# Patient Record
Sex: Female | Born: 1995 | Race: Black or African American | Hispanic: No | Marital: Single | Smoking: Former smoker
Health system: Southern US, Community
[De-identification: ages and names within clinical notes are randomized; demographics above are authoritative.]

## PROBLEM LIST (undated history)

## (undated) ENCOUNTER — Inpatient Hospital Stay (HOSPITAL_COMMUNITY): Payer: Self-pay

## (undated) DIAGNOSIS — F419 Anxiety disorder, unspecified: Secondary | ICD-10-CM

## (undated) DIAGNOSIS — R51 Headache: Secondary | ICD-10-CM

## (undated) DIAGNOSIS — F32A Depression, unspecified: Secondary | ICD-10-CM

## (undated) DIAGNOSIS — R519 Headache, unspecified: Secondary | ICD-10-CM

## (undated) HISTORY — PX: NO PAST SURGERIES: SHX2092

---
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
## 2016-01-20 DIAGNOSIS — Z87891 Personal history of nicotine dependence: Secondary | ICD-10-CM | POA: Diagnosis not present

## 2016-01-20 DIAGNOSIS — R079 Chest pain, unspecified: Secondary | ICD-10-CM | POA: Diagnosis present

## 2016-01-20 DIAGNOSIS — R51 Headache: Secondary | ICD-10-CM | POA: Diagnosis not present

## 2016-01-20 DIAGNOSIS — R0602 Shortness of breath: Secondary | ICD-10-CM | POA: Insufficient documentation

## 2016-01-21 ENCOUNTER — Emergency Department (HOSPITAL_COMMUNITY): Payer: Medicaid Other

## 2016-01-21 ENCOUNTER — Emergency Department (HOSPITAL_COMMUNITY)
Admission: EM | Admit: 2016-01-21 | Discharge: 2016-01-21 | Disposition: A | Discharge status: Home / Self Care | Payer: Medicaid Other | Attending: Emergency Medicine | Admitting: Emergency Medicine

## 2016-01-21 ENCOUNTER — Encounter (HOSPITAL_COMMUNITY): Payer: Self-pay | Admitting: Emergency Medicine

## 2016-01-21 DIAGNOSIS — R079 Chest pain, unspecified: Secondary | ICD-10-CM

## 2016-01-21 LAB — BASIC METABOLIC PANEL
Anion gap: 9 (ref 5–15)
BUN: 10 mg/dL (ref 6–20)
CHLORIDE: 103 mmol/L (ref 101–111)
CO2: 25 mmol/L (ref 22–32)
Calcium: 9 mg/dL (ref 8.9–10.3)
Creatinine, Ser: 0.58 mg/dL (ref 0.44–1.00)
GFR calc Af Amer: >60 mL/min (ref >60)
GLUCOSE: 88 mg/dL (ref 65–99)
POTASSIUM: 3.8 mmol/L (ref 3.5–5.1)
Sodium: 137 mmol/L (ref 135–145)

## 2016-01-21 LAB — CBC
HEMATOCRIT: 38.7 % (ref 36.0–46.0)
Hemoglobin: 12.6 g/dL (ref 12.0–15.0)
MCH: 28.5 pg (ref 26.0–34.0)
MCHC: 32.6 g/dL (ref 30.0–36.0)
MCV: 87.6 fL (ref 78.0–100.0)
Platelets: 214 10*3/uL (ref 150–400)
RBC: 4.42 MIL/uL (ref 3.87–5.11)
RDW: 12.1 % (ref 11.5–15.5)
WBC: 6 10*3/uL (ref 4.0–10.5)

## 2016-01-21 LAB — D-DIMER, QUANTITATIVE: D-Dimer, Quant: 0.27 ug/mL-FEU (ref 0.00–0.50)

## 2016-01-21 LAB — I-STAT TROPONIN, ED: Troponin i, poc: 0 ng/mL (ref 0.00–0.08)

## 2016-01-21 MED ORDER — IBUPROFEN 800 MG PO TABS
800.0000 mg | ORAL_TABLET | Freq: Once | ORAL | Status: AC
  Administered 2016-01-21: 800 mg via ORAL
  Filled 2016-01-21: qty 1

## 2016-01-21 MED ORDER — GI COCKTAIL ~~LOC~~
30.0000 mL | Freq: Once | ORAL | Status: AC
  Administered 2016-01-21: 30 mL via ORAL
  Filled 2016-01-21: qty 30

## 2016-01-21 MED ORDER — NAPROXEN 500 MG PO TABS: 500.0000 mg | ORAL_TABLET | Freq: Two times a day (BID) | ORAL | Status: DC

## 2016-01-21 NOTE — ED Notes (Signed)
C/o pressure in center of chest, headache, and sob with exertion x 3 days.  Denies nausea and vomiting.

## 2016-01-21 NOTE — ED Notes (Signed)
Lab at bedside

## 2016-01-21 NOTE — ED Provider Notes (Signed)
CSN: [id number]     Arrival date & time 01/20/16  2346 History   First MD Initiated Contact with Patient 01/21/16 0135     Chief Complaint  Patient presents with  . Chest Pain     (Consider location/radiation/quality/duration/timing/severity/associated sxs/prior Treatment) HPI Comments: 20 year old female with a past medical history presents to the emergency department for evaluation of central, nonradiating, chest pressure. Patient states that she has had symptoms intermittently over the past 3 days. She states that the pressure is worse with prolonged movement. She is also having a mild, global headache at this time. No medications taken prior to arrival for symptoms. Patient denies fever, nausea, vomiting, syncope, abdominal pain, leg swelling, and the use of birth control. No recent surgeries or hospitalizations. No personal or family history of blood clots. Patient denies prolonged travel.  Patient is a 20 y.o. female presenting with chest pain. The history is provided by the patient. No language interpreter was used.  Chest Pain Associated symptoms: shortness of breath   Associated symptoms: no nausea and not vomiting     Past Medical History  Diagnosis Date  . Medical history non-contributory    Past Surgical History  Procedure Laterality Date  . No past surgeries     No family history on file. Social History  Substance Use Topics  . Smoking status: Former Smoker    Types: Cigarettes    Quit date: 12/22/2015  . Smokeless tobacco: None  . Alcohol Use: Yes   OB History    Gravida Para Term Preterm AB TAB SAB Ectopic Multiple Living   3    3 1 2    0      Review of Systems  Respiratory: Positive for shortness of breath.   Cardiovascular: Positive for chest pain. Negative for leg swelling.  Gastrointestinal: Negative for nausea and vomiting.  Neurological: Negative for syncope.  All other systems reviewed and are negative.   Allergies  Review of patient's allergies  indicates no known allergies.  Home Medications   Prior to Admission medications   Medication Sig Start Date End Date Taking? Authorizing Provider  naproxen (NAPROSYN) 500 MG tablet Take 1 tablet (500 mg total) by mouth 2 (two) times daily. 01/21/16   Antony MaduraKelly Booker Bhatnagar, PA-C   BP 113/74 mmHg  Pulse 62  Temp(Src) 98.5 F (36.9 C) (Oral)  Resp 17  SpO2 100%  LMP 12/30/2015   Physical Exam  Constitutional: She is oriented to person, place, and time. She appears well-developed and well-nourished. No distress.  Nontoxic/nonseptic appearing.  HENT:  Head: Normocephalic and atraumatic.  Eyes: Conjunctivae and EOM are normal. No scleral icterus.  Neck: Normal range of motion.  No JVD  Cardiovascular: Normal rate, regular rhythm and intact distal pulses.   Pulmonary/Chest: Effort normal and breath sounds normal. No respiratory distress. She has no wheezes. She has no rales.  Respirations even and unlabored. Lungs CTAB.  Abdominal: Soft. She exhibits no distension. There is no tenderness. There is no rebound.  Soft, nontender, nondistended abdomen.  Musculoskeletal: Normal range of motion.  Neurological: She is alert and oriented to person, place, and time. She exhibits normal muscle tone. Coordination normal.  GCS 15. Patient moving all extremities.  Skin: Skin is warm and dry. No rash noted. She is not diaphoretic. No erythema. No pallor.  Psychiatric: She has a normal mood and affect. Her behavior is normal.  Nursing note and vitals reviewed.   ED Course  Procedures (including critical care time) Labs Review Labs Reviewed  BASIC METABOLIC PANEL  CBC  D-DIMER, QUANTITATIVE (NOT AT [facility])  Rosezena Sensor, ED    Imaging Review Dg Chest 2 View  01/21/2016  CLINICAL DATA:  Mid chest pain for 3 days EXAM: CHEST  2 VIEW COMPARISON:  None. FINDINGS: The heart size and mediastinal contours are within normal limits. Both lungs are clear. The visualized skeletal structures are unremarkable.  IMPRESSION: No active cardiopulmonary disease. Electronically Signed   By: Burman Nieves M.D.   On: 01/21/2016 00:52   I have personally reviewed and evaluated these images and lab results as part of my medical decision-making.   ED ECG REPORT   Date: 01/21/2016  Rate: 54  Rhythm: sinus bradycardia  QRS Axis: normal  Intervals: normal  ST/T Wave abnormalities: normal  Conduction Disutrbances:none  Narrative Interpretation: Sinus bradycardia; otherwise normal ECG  Old EKG Reviewed: none available  I have personally reviewed the EKG tracing and agree with the computerized printout as noted.   MDM   Final diagnoses:  Chest pain, unspecified chest pain type    20 year old female presents to the emergency department for evaluation of chest pain which is worse with prolonged movement. Suspect musculoskeletal etiology. No known family history of sudden cardiac death, per patient. Heart score is 1 for prior smoking hx, c/w low risk of ACS. Cardiac work up today is reassuring. Chest x-ray negative for acute cardiopulmonary findings. Patient also PERC negative with negative D dimer. Doubt PE.  Plan to manage with naproxen on an outpatient basis. Patient referred to her primary care doctor for follow-up. Return precautions discussed and provided. Patient discharged in satisfactory condition with no unaddressed concerns.   Filed Vitals:   01/21/16 0200 01/21/16 0215 01/21/16 0230 01/21/16 0258  BP: 123/84 114/66 113/74 118/65  Pulse: 66 57 62 58  Temp:      TempSrc:      Resp: SpO2: 100% 100% 100% 100%     Antony Madura, PA-C 01/21/16 0300  Geoffery Lyons, MD 01/21/16 [phone]

## 2016-01-21 NOTE — Discharge Instructions (Signed)
Nonspecific Chest Pain  °Chest pain can be caused by many different conditions. There is always a chance that your pain could be related to something serious, such as a heart attack or a blood clot in your lungs. Chest pain can also be caused by conditions that are not life-threatening. If you have chest pain, it is very important to follow up with your health care provider. °CAUSES  °Chest pain can be caused by: °· Heartburn. °· Pneumonia or bronchitis. °· Anxiety or stress. °· Inflammation around your heart (pericarditis) or lung (pleuritis or pleurisy). °· A blood clot in your lung. °· A collapsed lung (pneumothorax). It can develop suddenly on its own (spontaneous pneumothorax) or from trauma to the chest. °· Shingles infection (varicella-zoster virus). °· Heart attack. °· Damage to the bones, muscles, and cartilage that make up your chest wall. This can include: °¨ Bruised bones due to injury. °¨ Strained muscles or cartilage due to frequent or repeated coughing or overwork. °¨ Fracture to one or more ribs. °¨ Sore cartilage due to inflammation (costochondritis). °RISK FACTORS  °Risk factors for chest pain may include: °· Activities that increase your risk for trauma or injury to your chest. °· Respiratory infections or conditions that cause frequent coughing. °· Medical conditions or overeating that can cause heartburn. °· Heart disease or family history of heart disease. °· Conditions or health behaviors that increase your risk of developing a blood clot. °· Having had chicken pox (varicella zoster). °SIGNS AND SYMPTOMS °Chest pain can feel like: °· Burning or tingling on the surface of your chest or deep in your chest. °· Crushing, pressure, aching, or squeezing pain. °· Dull or sharp pain that is worse when you move, cough, or take a deep breath. °· Pain that is also felt in your back, neck, shoulder, or arm, or pain that spreads to any of these areas. °Your chest pain may come and go, or it may stay  constant. °DIAGNOSIS °Lab tests or other studies may be needed to find the cause of your pain. Your health care provider may have you take a test called an ambulatory ECG (electrocardiogram). An ECG records your heartbeat patterns at the time the test is performed. You may also have other tests, such as: °· Transthoracic echocardiogram (TTE). During echocardiography, sound waves are used to create a picture of all of the heart structures and to look at how blood flows through your heart. °· Transesophageal echocardiogram (TEE). This is a more advanced imaging test that obtains images from inside your body. It allows your health care provider to see your heart in finer detail. °· Cardiac monitoring. This allows your health care provider to monitor your heart rate and rhythm in real time. °· Holter monitor. This is a portable device that records your heartbeat and can help to diagnose abnormal heartbeats. It allows your health care provider to track your heart activity for several days, if needed. °· Stress tests. These can be done through exercise or by taking medicine that makes your heart beat more quickly. °· Blood tests. °· Imaging tests. °TREATMENT  °Your treatment depends on what is causing your chest pain. Treatment may include: °· Medicines. These may include: °¨ Acid blockers for heartburn. °¨ Anti-inflammatory medicine. °¨ Pain medicine for inflammatory conditions. °¨ Antibiotic medicine, if an infection is present. °¨ Medicines to dissolve blood clots. °¨ Medicines to treat coronary artery disease. °· Supportive care for conditions that do not require medicines. This may include: °¨ Resting. °¨ Applying heat   or cold packs to injured areas. °¨ Limiting activities until pain decreases. °HOME CARE INSTRUCTIONS °· If you were prescribed an antibiotic medicine, finish it all even if you start to feel better. °· Avoid any activities that bring on chest pain. °· Do not use any tobacco products, including  cigarettes, chewing tobacco, or electronic cigarettes. If you need help quitting, ask your health care provider. °· Do not drink alcohol. °· Take medicines only as directed by your health care provider. °· Keep all follow-up visits as directed by your health care provider. This is important. This includes any further testing if your chest pain does not go away. °· If heartburn is the cause for your chest pain, you may be told to keep your head raised (elevated) while sleeping. This reduces the chance that acid will go from your stomach into your esophagus. °· Make lifestyle changes as directed by your health care provider. These may include: °¨ Getting regular exercise. Ask your health care provider to suggest some activities that are safe for you. °¨ Eating a heart-healthy diet. A registered dietitian can help you to learn healthy eating options. °¨ Maintaining a healthy weight. °¨ Managing diabetes, if necessary. °¨ Reducing stress. °SEEK MEDICAL CARE IF: °· Your chest pain does not go away after treatment. °· You have a rash with blisters on your chest. °· You have a fever. °SEEK IMMEDIATE MEDICAL CARE IF:  °· Your chest pain is worse. °· You have an increasing cough, or you cough up blood. °· You have severe abdominal pain. °· You have severe weakness. °· You faint. °· You have chills. °· You have sudden, unexplained chest discomfort. °· You have sudden, unexplained discomfort in your arms, back, neck, or jaw. °· You have shortness of breath at any time. °· You suddenly start to sweat, or your skin gets clammy. °· You feel nauseous or you vomit. °· You suddenly feel light-headed or dizzy. °· Your heart begins to beat quickly, or it feels like it is skipping beats. °These symptoms may represent a serious problem that is an emergency. Do not wait to see if the symptoms will go away. Get medical help right away. Call your local emergency services (911 in the [place] Do not drive yourself to the hospital. °  °This  information is not intended to replace advice given to you by your health care provider. Make sure you discuss any questions you have with your health care provider. °  °Document Released: 06/15/2005 Document Revised: 09/26/2014 Document Reviewed: 04/11/2014 °Elsevier Interactive Patient Education ©2016 [employer] ° °

## 2016-09-28 ENCOUNTER — Encounter (HOSPITAL_COMMUNITY): Payer: Self-pay | Admitting: Emergency Medicine

## 2016-09-28 ENCOUNTER — Emergency Department (HOSPITAL_COMMUNITY)
Admission: EM | Admit: 2016-09-28 | Discharge: 2016-09-29 | Disposition: A | Discharge status: Home / Self Care | Payer: Medicaid Other | Attending: Emergency Medicine | Admitting: Emergency Medicine

## 2016-09-28 DIAGNOSIS — Z87891 Personal history of nicotine dependence: Secondary | ICD-10-CM | POA: Diagnosis not present

## 2016-09-28 DIAGNOSIS — B349 Viral infection, unspecified: Secondary | ICD-10-CM | POA: Diagnosis not present

## 2016-09-28 DIAGNOSIS — R05 Cough: Secondary | ICD-10-CM | POA: Diagnosis present

## 2016-09-28 MED ORDER — SODIUM CHLORIDE 0.9 % IV BOLUS (SEPSIS)
1000.0000 mL | Freq: Once | INTRAVENOUS | Status: AC
  Administered 2016-09-29: 1000 mL via INTRAVENOUS

## 2016-09-28 MED ORDER — ONDANSETRON HCL 4 MG/2ML IJ SOLN
4.0000 mg | Freq: Once | INTRAMUSCULAR | Status: AC
  Administered 2016-09-29: 4 mg via INTRAVENOUS
  Filled 2016-09-28: qty 2

## 2016-09-28 NOTE — ED Provider Notes (Signed)
[facility]-EMERGENCY DEPT Provider Note   CSN: [id number] Arrival date & time: 09/28/16  1832     History   Chief Complaint Chief Complaint  Patient presents with  . Emesis  . Cough    HPI Allison Hamilton is a 21 y.o. female.  HPI 21 y.o. female, presents to the Emergency Department today complaining of emesis and cough since Thursday of last week. Notes hot flashes with subjective fevers. No diarrhea. No CP/ABD pain. Notes mild shortness of breath with coughing. Sore throat as well as congestion. Attempted home remedies with minimal relief. Noted sick contacts. No other symptoms noted.   Past Medical History:  Diagnosis Date  . Medical history non-contributory     There are no active problems to display for this patient.   Past Surgical History:  Procedure Laterality Date  . NO PAST SURGERIES      OB History    Gravida Para Term Preterm AB Living   3       3 0   SAB TAB Ectopic Multiple Live Births   2 1             Home Medications    Prior to Admission medications   Medication Sig Start Date End Date Taking? Authorizing Provider  naproxen (NAPROSYN) 500 MG tablet Take 1 tablet (500 mg total) by mouth 2 (two) times daily. 01/21/16   Antony Madura, PA-C    Family History History reviewed. No pertinent family history.  Social History Social History  Substance Use Topics  . Smoking status: Former Smoker    Types: Cigarettes    Quit date: 12/22/2015  . Smokeless tobacco: Never Used  . Alcohol use Yes     Allergies   Patient has no known allergies.   Review of Systems Review of Systems ROS reviewed and all are negative for acute change except as noted in the HPI.  Physical Exam Updated Vital Signs BP 120/69 (BP Location: Left Arm)   Pulse 113   Temp 98.7 F (37.1 C) (Oral)   Resp 15   Ht 5\' 3"  (1.6 m)   LMP 09/25/2016   SpO2 99%   Physical Exam  Constitutional: She is oriented to person, place, and time. Vital signs are normal. She appears  well-developed and well-nourished. No distress.  HENT:  Head: Normocephalic and atraumatic.  Right Ear: Hearing, tympanic membrane, external ear and ear canal normal.  Left Ear: Hearing, tympanic membrane, external ear and ear canal normal.  Nose: Nose normal.  Mouth/Throat: Uvula is midline, oropharynx is clear and moist and mucous membranes are normal. No trismus in the jaw. No oropharyngeal exudate, posterior oropharyngeal erythema or tonsillar abscesses.  Eyes: Conjunctivae and EOM are normal. Pupils are equal, round, and reactive to light.  Neck: Normal range of motion. Neck supple. No tracheal deviation present.  Cardiovascular: Regular rhythm, S1 normal, S2 normal, normal heart sounds, intact distal pulses and normal pulses.  Tachycardia present.   Pulmonary/Chest: Effort normal and breath sounds normal. No respiratory distress. She has no decreased breath sounds. She has no wheezes. She has no rhonchi. She has no rales.  Abdominal: Soft. Normal appearance and bowel sounds are normal. There is no tenderness. There is no rigidity, no rebound, no guarding, no CVA tenderness, no tenderness at McBurney's point and negative Murphy's sign.  Musculoskeletal: Normal range of motion.  Neurological: She is alert and oriented to person, place, and time.  Skin: Skin is warm and dry.  Psychiatric: She has a normal mood  and affect. Her speech is normal and behavior is normal. Thought content normal.  Nursing note and vitals reviewed.  ED Treatments / Results  Labs (all labs ordered are listed, but only abnormal results are displayed) Labs Reviewed  CBC - Abnormal; Notable for the following:       Result Value   WBC 11.2 (*)    All other components within normal limits  BASIC METABOLIC PANEL - Abnormal; Notable for the following:    Sodium 134 (*)    Potassium 3.0 (*)    Chloride 98 (*)    All other components within normal limits   EKG  EKG Interpretation None      Radiology Dg Chest  2 View  Result Date: 09/29/2016 CLINICAL DATA:  Acute onset of cough, vomiting, hot flashes and throat pain. Initial encounter. EXAM: CHEST  2 VIEW COMPARISON:  Chest radiograph performed 01/21/2016 FINDINGS: The lungs are well-aerated and clear. There is no evidence of focal opacification, pleural effusion or pneumothorax. The heart is normal in size; the mediastinal contour is within normal limits. No acute osseous abnormalities are seen. IMPRESSION: No acute cardiopulmonary process seen. Electronically Signed   By: Roanna RaiderJeffery  Chang M.D.   On: 09/29/2016 00:10    Procedures Procedures (including critical care time)  Medications Ordered in ED Medications  sodium chloride 0.9 % bolus 1,000 mL (not administered)   Initial Impression / Assessment and Plan / ED Course  I have reviewed the triage vital signs and the nursing notes.  Pertinent labs & imaging results that were available during my care of the patient were reviewed by me and considered in my medical decision making (see chart for details).  Clinical Course    Final Clinical Impressions(s) / ED Diagnoses  {I have reviewed and evaluated the relevant laboratory values. {I have reviewed and evaluated the relevant imaging studies.  {I have reviewed the relevant previous healthcare records.  {I obtained HPI from historian.   ED Course:  Assessment: Pt is a 20yF presents with URI symptoms with cough and subjective fevers. Post tussive emesis noted. No CP/ABD pain. No diarrhea. On exam, pt in NAD. VS with slight tachycardia. Afebrile. Lungs CTA, Heart RRR. Abdomen nontender/soft. Pt CXR negative for acute infiltrate. Labs unremarkable. Patients symptoms are consistent with URI, likely viral etiology. Discussed that antibiotics are not indicated for viral infections. Pt will be discharged with symptomatic treatment. Given rx Zofran and Tessalon. Pt improved with fluids in ED. Verbalizes understanding and is agreeable with plan. Pt is  hemodynamically stable & in NAD prior to dc  Disposition/Plan:  DC Home Additional Verbal discharge instructions given and discussed with patient.  Pt Instructed to f/u with PCP in the next week for evaluation and treatment of symptoms. Return precautions given Pt acknowledges and agrees with plan  Supervising Physician Azalia BilisKevin Campos, MD  Final diagnoses:  Viral syndrome    New Prescriptions New Prescriptions   No medications on file     Audry Piliyler Kharis Lapenna, PA-C 09/29/16 0101    Azalia BilisKevin Campos, MD 09/29/16 [phone]

## 2016-09-28 NOTE — ED Triage Notes (Signed)
Pt states she has been sick since thurs with cough, vomiting, hot flashes, throat pain.

## 2016-09-29 ENCOUNTER — Emergency Department (HOSPITAL_COMMUNITY): Payer: Medicaid Other

## 2016-09-29 LAB — BASIC METABOLIC PANEL WITH GFR
Anion gap: 14 (ref 5–15)
BUN: 9 mg/dL (ref 6–20)
CO2: 22 mmol/L (ref 22–32)
Calcium: 9.1 mg/dL (ref 8.9–10.3)
Chloride: 98 mmol/L — ABNORMAL LOW (ref 101–111)
Creatinine, Ser: 0.68 mg/dL (ref 0.44–1.00)
GFR calc Af Amer: >60 mL/min
GFR calc non Af Amer: >60 mL/min
Glucose, Bld: 98 mg/dL (ref 65–99)
Potassium: 3 mmol/L — ABNORMAL LOW (ref 3.5–5.1)
Sodium: 134 mmol/L — ABNORMAL LOW (ref 135–145)

## 2016-09-29 LAB — CBC
HCT: 38.2 % (ref 36.0–46.0)
Hemoglobin: 13.2 g/dL (ref 12.0–15.0)
MCH: 28.3 pg (ref 26.0–34.0)
MCHC: 34.6 g/dL (ref 30.0–36.0)
MCV: 82 fL (ref 78.0–100.0)
Platelets: 221 K/uL (ref 150–400)
RBC: 4.66 MIL/uL (ref 3.87–5.11)
RDW: 12.5 % (ref 11.5–15.5)
WBC: 11.2 K/uL — ABNORMAL HIGH (ref 4.0–10.5)

## 2016-09-29 MED ORDER — KETOROLAC TROMETHAMINE 30 MG/ML IJ SOLN
30.0000 mg | Freq: Once | INTRAMUSCULAR | Status: AC
  Administered 2016-09-29: 30 mg via INTRAVENOUS
  Filled 2016-09-29: qty 1

## 2016-09-29 MED ORDER — ONDANSETRON HCL 4 MG PO TABS: 4.0000 mg | ORAL_TABLET | Freq: Four times a day (QID) | ORAL | 0 refills | Status: DC

## 2016-09-29 MED ORDER — POTASSIUM CHLORIDE CRYS ER 20 MEQ PO TBCR
40.0000 meq | EXTENDED_RELEASE_TABLET | Freq: Once | ORAL | Status: AC
  Administered 2016-09-29: 40 meq via ORAL
  Filled 2016-09-29: qty 2

## 2016-09-29 MED ORDER — BENZONATATE 100 MG PO CAPS: 100.0000 mg | ORAL_CAPSULE | Freq: Three times a day (TID) | ORAL | 0 refills | Status: DC

## 2016-09-29 NOTE — Discharge Instructions (Signed)
Please read and follow all provided instructions.  Your diagnoses today include:  1. Viral syndrome     You appear to have an upper respiratory infection (URI). An upper respiratory tract infection, or cold, is a viral infection of the air passages leading to the lungs. It should improve gradually after 5-7 days. You may have a lingering cough that lasts for 2- 4 weeks after the infection.  Tests performed today include: Vital signs. See below for your results today.   Medications prescribed:   Take any prescribed medications only as directed. Treatment for your infection is aimed at treating the symptoms. There are no medications, such as antibiotics, that will cure your infection.   Home care instructions:  Follow any educational materials contained in this packet.   Your illness is contagious and can be spread to others, especially during the first 3 or 4 days. It cannot be cured by antibiotics or other medicines. Take basic precautions such as washing your hands often, covering your mouth when you cough or sneeze, and avoiding public places where you could spread your illness to others.   Please continue drinking plenty of fluids.  Use over-the-counter medicines as needed as directed on packaging for symptom relief.  You may also use ibuprofen or tylenol as directed on packaging for pain or fever.  Do not take multiple medicines containing Tylenol or acetaminophen to avoid taking too much of this medication.  Follow-up instructions: Please follow-up with your primary care provider in the next 3 days for further evaluation of your symptoms if you are not feeling better.   Return instructions:  Please return to the Emergency Department if you experience worsening symptoms.  RETURN IMMEDIATELY IF you develop shortness of breath, confusion or altered mental status, a new rash, become dizzy, faint, or poorly responsive, or are unable to be cared for at home. Please return if you have  persistent vomiting and cannot keep down fluids or develop a fever that is not controlled by tylenol or motrin.   Please return if you have any other emergent concerns.  Additional Information:  Your vital signs today were: BP 120/69 (BP Location: Left Arm)    Pulse 113    Temp 98.7 F (37.1 C) (Oral)    Resp 15    Ht 5\' 3"  (1.6 m)    LMP 09/25/2016    SpO2 99%  If your blood pressure (BP) was elevated above 135/85 this visit, please have this repeated by your doctor within one month. --------------

## 2017-03-26 ENCOUNTER — Encounter (HOSPITAL_COMMUNITY): Payer: Self-pay

## 2017-03-26 ENCOUNTER — Emergency Department (HOSPITAL_COMMUNITY)
Admission: EM | Admit: 2017-03-26 | Discharge: 2017-03-27 | Disposition: A | Discharge status: Home / Self Care | Payer: Medicaid Other | Attending: Emergency Medicine | Admitting: Emergency Medicine

## 2017-03-26 DIAGNOSIS — Y92009 Unspecified place in unspecified non-institutional (private) residence as the place of occurrence of the external cause: Secondary | ICD-10-CM | POA: Diagnosis not present

## 2017-03-26 DIAGNOSIS — Z79899 Other long term (current) drug therapy: Secondary | ICD-10-CM | POA: Insufficient documentation

## 2017-03-26 DIAGNOSIS — Y9389 Activity, other specified: Secondary | ICD-10-CM | POA: Diagnosis not present

## 2017-03-26 DIAGNOSIS — Z87891 Personal history of nicotine dependence: Secondary | ICD-10-CM | POA: Diagnosis not present

## 2017-03-26 DIAGNOSIS — F331 Major depressive disorder, recurrent, moderate: Secondary | ICD-10-CM | POA: Diagnosis present

## 2017-03-26 DIAGNOSIS — F329 Major depressive disorder, single episode, unspecified: Secondary | ICD-10-CM | POA: Diagnosis present

## 2017-03-26 DIAGNOSIS — F4321 Adjustment disorder with depressed mood: Secondary | ICD-10-CM

## 2017-03-26 DIAGNOSIS — Z046 Encounter for general psychiatric examination, requested by authority: Secondary | ICD-10-CM | POA: Diagnosis not present

## 2017-03-26 DIAGNOSIS — T1491XA Suicide attempt, initial encounter: Secondary | ICD-10-CM | POA: Diagnosis not present

## 2017-03-26 DIAGNOSIS — Y999 Unspecified external cause status: Secondary | ICD-10-CM | POA: Insufficient documentation

## 2017-03-26 DIAGNOSIS — X781XXA Intentional self-harm by knife, initial encounter: Secondary | ICD-10-CM | POA: Diagnosis not present

## 2017-03-26 DIAGNOSIS — R45851 Suicidal ideations: Secondary | ICD-10-CM | POA: Diagnosis not present

## 2017-03-26 LAB — CBC
HCT: 39.1 % (ref 36.0–46.0)
Hemoglobin: 13.5 g/dL (ref 12.0–15.0)
MCH: 28.8 pg (ref 26.0–34.0)
MCHC: 34.5 g/dL (ref 30.0–36.0)
MCV: 83.4 fL (ref 78.0–100.0)
PLATELETS: 236 10*3/uL (ref 150–400)
RBC: 4.69 MIL/uL (ref 3.87–5.11)
RDW: 14.1 % (ref 11.5–15.5)
WBC: 13.3 10*3/uL — ABNORMAL HIGH (ref 4.0–10.5)

## 2017-03-26 LAB — RAPID URINE DRUG SCREEN, HOSP PERFORMED
AMPHETAMINES: NOT DETECTED
BENZODIAZEPINES: NOT DETECTED
Barbiturates: NOT DETECTED
Cocaine: NOT DETECTED
OPIATES: NOT DETECTED
Tetrahydrocannabinol: POSITIVE — AB

## 2017-03-26 LAB — COMPREHENSIVE METABOLIC PANEL
ALT: 15 U/L (ref 14–54)
AST: 27 U/L (ref 15–41)
Albumin: 4.7 g/dL (ref 3.5–5.0)
Alkaline Phosphatase: 80 U/L (ref 38–126)
Anion gap: 11 (ref 5–15)
BUN: 10 mg/dL (ref 6–20)
CHLORIDE: 104 mmol/L (ref 101–111)
CO2: 22 mmol/L (ref 22–32)
Calcium: 9.3 mg/dL (ref 8.9–10.3)
Creatinine, Ser: 0.8 mg/dL (ref 0.44–1.00)
Glucose, Bld: 132 mg/dL — ABNORMAL HIGH (ref 65–99)
POTASSIUM: 3.5 mmol/L (ref 3.5–5.1)
Sodium: 137 mmol/L (ref 135–145)
Total Bilirubin: 0.5 mg/dL (ref 0.3–1.2)
Total Protein: 8.8 g/dL — ABNORMAL HIGH (ref 6.5–8.1)

## 2017-03-26 LAB — ETHANOL

## 2017-03-26 LAB — I-STAT BETA HCG BLOOD, ED (MC, WL, AP ONLY): I-stat hCG, quantitative: <5 m[IU]/mL (ref <5)

## 2017-03-26 LAB — ACETAMINOPHEN LEVEL
Acetaminophen (Tylenol), Serum: 49 ug/mL — ABNORMAL HIGH (ref 10–30)
Acetaminophen (Tylenol), Serum: <10 ug/mL — ABNORMAL LOW (ref 10–30)

## 2017-03-26 LAB — SALICYLATE LEVEL

## 2017-03-26 MED ORDER — IBUPROFEN 200 MG PO TABS
600.0000 mg | ORAL_TABLET | Freq: Three times a day (TID) | ORAL | Status: DC | PRN
  Administered 2017-03-26: 600 mg via ORAL
  Filled 2017-03-26: qty 3

## 2017-03-26 MED ORDER — ALUM & MAG HYDROXIDE-SIMETH 200-200-20 MG/5ML PO SUSP: 30.0000 mL | Freq: Four times a day (QID) | ORAL | Status: DC | PRN

## 2017-03-26 MED ORDER — ONDANSETRON HCL 4 MG PO TABS: 4.0000 mg | ORAL_TABLET | Freq: Three times a day (TID) | ORAL | Status: DC | PRN

## 2017-03-26 MED ORDER — LORAZEPAM 1 MG PO TABS
1.0000 mg | ORAL_TABLET | Freq: Once | ORAL | Status: AC
  Administered 2017-03-26: 1 mg via ORAL
  Filled 2017-03-26: qty 1

## 2017-03-26 NOTE — ED Notes (Signed)
No reaction to medication noted resting in bed with eyes closed hospital sitter watching pt no respiratory or acute distress noted.

## 2017-03-26 NOTE — Progress Notes (Signed)
TTS consult ordered at 21:56. No EDP note in the system. It is unclear if the pt is medically cleared and able to be assessed due to no EDP note. Arlys JohnBrian, RN notified there is no EDP note.  Princess BruinsAquicha Yader Criger, MSW, LCSWA TTS Specialist [phone]

## 2017-03-26 NOTE — ED Notes (Signed)
Pt upset and crying wants something to help her calm down no respiratory or acute distress noted alert and oriented x 3 hospital sitter at bedside.
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
## 2017-03-26 NOTE — ED Triage Notes (Signed)
Per [employer], called to residence for pt attempting to take knife to cut herself.  Pt had knife in hand and removed by mom.  Tearful on assessment.  [employer] taking IVC.  No lacerations noted.

## 2017-03-27 DIAGNOSIS — F4321 Adjustment disorder with depressed mood: Secondary | ICD-10-CM

## 2017-03-27 DIAGNOSIS — Z87891 Personal history of nicotine dependence: Secondary | ICD-10-CM | POA: Diagnosis not present

## 2017-03-27 DIAGNOSIS — F331 Major depressive disorder, recurrent, moderate: Secondary | ICD-10-CM | POA: Diagnosis not present

## 2017-03-27 NOTE — Discharge Instructions (Signed)
For your ongoing behavioral health needs you are advised to follow up with [facility].  New patients are seen at their walk-in clinic.  Walk-in hours are Monday - Friday from 8:00 am - 12:00 pm, and from 1:00 pm - 3:00 pm.  Walk-in patients are seen on a first come, first served basis, so try to arrive as early as possible for the best chance of being seen the same day.  There is an initial fee of $22.50: ° °     [facility] °     [address] °     [place], [place] [address] °     [phone] °

## 2017-03-27 NOTE — ED Notes (Signed)
No respiratory or acute distress noted no reaction to medication noted call light in reach hospital sitter with pt.

## 2017-03-27 NOTE — Consult Note (Signed)
[facility] Face-to-Face Psychiatry Consult   Reason for Consult:  Depression  Referring Physician:  EDP Patient Identification: Allison Hamilton MRN:  [id number] Principal Diagnosis: Major depressive disorder, recurrent episode, moderate ([place]) Diagnosis:   Patient Active Problem List   Diagnosis Date Noted  . Major depressive disorder, recurrent episode, moderate ([place]) [F33.1] 03/27/2017    Priority: High    Total Time spent with patient: 45 minutes  Subjective:   Allison Hamilton is a 21 y.o. female patient does not warrant admission.  HPI:  21 yo female who presented to the ED after making an impulsive gesture.  She grabbed a knife to hurt herself but willingly and promptly gave it to her boyfriend.  When he told her mother, the mother IVC'd her.  The boyfriend is supportive and her bedside, provides collateral information with patient's permission.  They live together and he has no safety concerns.  His desire is for her to get counseling for the twins that she lost at 11 weeks when she was 14.  She talks to him about her grief but he feels she needs someone else, she is agreeable.  Neither want inpatient hospitalization.  No suicidal ideations or suicide attempts, no homicidal ideations, no alcohol/drug abuse.  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  She nor he have any safety concerns.  Stable for discharge.  Past Psychiatric History: depression  Risk to Self: NOne Risk to Others: Homicidal Ideation: No Thoughts of Harm to Others: No Current Homicidal Intent: No Current Homicidal Plan: No Access to Homicidal Means: No History of harm to others?: No Assessment of Violence: None Noted Does patient have access to weapons?: No Criminal Charges Pending?: No Does patient have a court date: No Prior Inpatient Therapy: Prior Inpatient Therapy: No Prior Outpatient  Therapy: Prior Outpatient Therapy: No Does patient have an ACCT team?: No Does patient have Intensive In-House Services?  : No Does patient have [facility] services? : No Does patient have [facility] services?: No  Past Medical History:  Past Medical History:  Diagnosis Date  . Medical history non-contributory     Past Surgical History:  Procedure Laterality Date  . NO PAST SURGERIES     Family History: History reviewed. No pertinent family history. Family Psychiatric  History: none Social History:  History  Alcohol Use  . Yes     History  Drug Use No    Social History   Social History  . Marital status: Single    Spouse name: N/A  . Number of children: N/A  . Years of education: N/A   Social History Main Topics  . Smoking status: Former Smoker    Types: Cigarettes    Quit date: 12/22/2015  . Smokeless tobacco: Never Used  . Alcohol use Yes  . Drug use: No  . Sexual activity: Yes    Birth control/ protection: None   Other Topics Concern  . None   Social History Narrative  . None   Additional Social History:    Allergies:  No Known Allergies  Labs:  Results for orders placed or performed during the hospital encounter of 03/26/17 (from the past 48 hour(s))  Comprehensive metabolic panel     Status: Abnormal   Collection Time: 03/26/17  3:09 PM  Result Value Ref Range   Sodium 137 135 - 145 mmol/L   Potassium 3.5 3.5 - 5.1 mmol/L  Chloride 104 101 - 111 mmol/L   CO2 22 22 - 32 mmol/L   Glucose, Bld 132 (H) 65 - 99 mg/dL   BUN 10 6 - 20 mg/dL   Creatinine, Ser 0.80 0.44 - 1.00 mg/dL   Calcium 9.3 8.9 - 10.3 mg/dL   Total Protein 8.8 (H) 6.5 - 8.1 g/dL   Albumin 4.7 3.5 - 5.0 g/dL   AST 27 15 - 41 U/L   ALT 15 14 - 54 U/L   Alkaline Phosphatase 80 38 - 126 U/L   Total Bilirubin 0.5 0.3 - 1.2 mg/dL   GFR calc non Af Amer >60 >60 mL/min   GFR calc Af Amer >60 >60 mL/min    Comment: (NOTE) The eGFR has been calculated using the CKD EPI equation. This  calculation has not been validated in all clinical situations. eGFR's persistently <60 mL/min signify possible Chronic Kidney Disease.    Anion gap 11 5 - 15  Ethanol     Status: None   Collection Time: 03/26/17  3:09 PM  Result Value Ref Range   Alcohol, Ethyl (B) <5 <5 mg/dL    Comment:        LOWEST DETECTABLE LIMIT FOR SERUM ALCOHOL IS 5 mg/dL FOR MEDICAL PURPOSES ONLY   Salicylate level     Status: None   Collection Time: 03/26/17  3:09 PM  Result Value Ref Range   Salicylate Lvl <6.6 2.8 - 30.0 mg/dL  Acetaminophen level     Status: Abnormal   Collection Time: 03/26/17  3:09 PM  Result Value Ref Range   Acetaminophen (Tylenol), Serum 49 (H) 10 - 30 ug/mL    Comment:        THERAPEUTIC CONCENTRATIONS VARY SIGNIFICANTLY. A RANGE OF 10-30 ug/mL MAY BE AN EFFECTIVE CONCENTRATION FOR MANY PATIENTS. HOWEVER, SOME ARE BEST TREATED AT CONCENTRATIONS OUTSIDE THIS RANGE. ACETAMINOPHEN CONCENTRATIONS >150 ug/mL AT 4 HOURS AFTER INGESTION AND >50 ug/mL AT 12 HOURS AFTER INGESTION ARE OFTEN ASSOCIATED WITH TOXIC REACTIONS.   cbc     Status: Abnormal   Collection Time: 03/26/17  3:09 PM  Result Value Ref Range   WBC 13.3 (H) 4.0 - 10.5 K/uL   RBC 4.69 3.87 - 5.11 MIL/uL   Hemoglobin 13.5 12.0 - 15.0 g/dL   HCT 39.1 36.0 - 46.0 %   MCV 83.4 78.0 - 100.0 fL   MCH 28.8 26.0 - 34.0 pg   MCHC 34.5 30.0 - 36.0 g/dL   RDW 14.1 11.5 - 15.5 %   Platelets 236 150 - 400 K/uL  Rapid urine drug screen (hospital performed)     Status: Abnormal   Collection Time: 03/26/17  3:15 PM  Result Value Ref Range   Opiates NONE DETECTED NONE DETECTED   Cocaine NONE DETECTED NONE DETECTED   Benzodiazepines NONE DETECTED NONE DETECTED   Amphetamines NONE DETECTED NONE DETECTED   Tetrahydrocannabinol POSITIVE (A) NONE DETECTED   Barbiturates NONE DETECTED NONE DETECTED    Comment:        DRUG SCREEN FOR MEDICAL PURPOSES ONLY.  IF CONFIRMATION IS NEEDED FOR ANY PURPOSE, NOTIFY LAB WITHIN 5  DAYS.        LOWEST DETECTABLE LIMITS FOR URINE DRUG SCREEN Drug Class       Cutoff (ng/mL) Amphetamine      1000 Barbiturate      200 Benzodiazepine   599 Tricyclics       357 Opiates          300 Cocaine  300 THC              50   I-Stat beta hCG blood, ED     Status: None   Collection Time: 03/26/17  3:17 PM  Result Value Ref Range   I-stat hCG, quantitative <5.0 <5 mIU/mL   Comment 3            Comment:   GEST. AGE      CONC.  (mIU/mL)   <=1 WEEK        5 - 50     2 WEEKS       50 - 500     3 WEEKS       100 - 10,000     4 WEEKS     1,000 - 30,000        FEMALE AND NON-PREGNANT FEMALE:     LESS THAN 5 mIU/mL   Acetaminophen level     Status: Abnormal   Collection Time: 03/26/17  7:47 PM  Result Value Ref Range   Acetaminophen (Tylenol), Serum <10 (L) 10 - 30 ug/mL    Comment:        THERAPEUTIC CONCENTRATIONS VARY SIGNIFICANTLY. A RANGE OF 10-30 ug/mL MAY BE AN EFFECTIVE CONCENTRATION FOR MANY PATIENTS. HOWEVER, SOME ARE BEST TREATED AT CONCENTRATIONS OUTSIDE THIS RANGE. ACETAMINOPHEN CONCENTRATIONS >150 ug/mL AT 4 HOURS AFTER INGESTION AND >50 ug/mL AT 12 HOURS AFTER INGESTION ARE OFTEN ASSOCIATED WITH TOXIC REACTIONS.     Current Facility-Administered Medications  Medication Dose Route Frequency Provider Last Rate Last Dose  . alum & mag hydroxide-simeth (MAALOX/MYLANTA) 200-200-20 MG/5ML suspension 30 mL  30 mL Oral Q6H PRN Lawyer, Christopher, PA-C      . ibuprofen (ADVIL,MOTRIN) tablet 600 mg  600 mg Oral Q8H PRN Dalia Heading, PA-C   600 mg at 03/26/17 2002  . ondansetron (ZOFRAN) tablet 4 mg  4 mg Oral Q8H PRN Dalia Heading, PA-C       Current Outpatient Prescriptions  Medication Sig Dispense Refill  . diphenhydramine-acetaminophen (TYLENOL PM) 25-500 MG TABS tablet Take 2 tablets by mouth at bedtime as needed (sleep).    . benzonatate (TESSALON) 100 MG capsule Take 1 capsule (100 mg total) by mouth every 8 (eight) hours. (Patient  not taking: Reported on 03/26/2017) 21 capsule 0  . naproxen (NAPROSYN) 500 MG tablet Take 1 tablet (500 mg total) by mouth 2 (two) times daily. (Patient not taking: Reported on 03/26/2017) 30 tablet 0  . ondansetron (ZOFRAN) 4 MG tablet Take 1 tablet (4 mg total) by mouth every 6 (six) hours. (Patient not taking: Reported on 03/26/2017) 12 tablet 0    Musculoskeletal: Strength & Muscle Tone: within normal limits Gait & Station: normal Patient leans: N/A  Psychiatric Specialty Exam: Physical Exam  Constitutional: She is oriented to person, place, and time. She appears well-developed and well-nourished.  HENT:  Head: Normocephalic.  Neck: Normal range of motion.  Respiratory: Effort normal.  Musculoskeletal: Normal range of motion.  Neurological: She is alert and oriented to person, place, and time.  Psychiatric: Her speech is normal and behavior is normal. Judgment and thought content normal. Cognition and memory are normal. She exhibits a depressed mood.    Review of Systems  Psychiatric/Behavioral: Positive for depression.  All other systems reviewed and are negative.   Blood pressure 126/66, pulse 97, temperature 98.7 F (37.1 C), temperature source Oral, resp. rate 18, height _0  (1.6 m), weight 63.5 kg (140 lb), SpO2 100 %.Body mass index is  24.8 kg/m.  General Appearance: Casual  Eye Contact:  Good  Speech:  Normal Rate  Volume:  Normal  Mood:  Depressed  Affect:  Congruent  Thought Process:  Coherent and Descriptions of Associations: Intact  Orientation:  Full (Time, Place, and Person)  Thought Content:  WDL and Logical  Suicidal Thoughts:  No  Homicidal Thoughts:  No  Memory:  Immediate;   Good Recent;   Good Remote;   Good  Judgement:  Fair  Insight:  Good  Psychomotor Activity:  Normal  Concentration:  Concentration: Good and Attention Span: Good  Recall:  Good  Fund of Knowledge:  Good  Language:  Good  Akathisia:  No  Handed:  Right  AIMS (if indicated):      Assets:  Housing Leisure Time Physical Health Resilience Social Support  ADL's:  Intact  Cognition:  WNL  Sleep:        Treatment Plan Summary: Daily contact with patient to assess and evaluate symptoms and progress in treatment, Medication management and Plan major depressive disorder, recurrent, moderate:  -Crisis stabilization -Medication management:  None started, following up outpatient -Outpatient resources and appointments made -Individual counseling  Disposition: No evidence of imminent risk to self or others at present.    Waylan Boga, NP 03/27/2017 12:10 PM  Patient seen face-to-face for psychiatric evaluation, chart reviewed and case discussed with the physician extender and developed treatment plan. Reviewed the information documented and agree with the treatment plan. Corena Pilgrim, MD

## 2017-03-27 NOTE — ED Provider Notes (Signed)
[facility]-EMERGENCY DEPT Provider Note   CSN: [id number] Arrival date & time: 03/26/17  1448     History   Chief Complaint Chief Complaint  Patient presents with  . Suicidal    HPI Allison Hamilton is a 21 y.o. female.  HPI Patient presents to the emergency department with suicidal ideations.  The patient was at home.  Are you with her mother when she pulled out tonight.  The mother had to wrestle the knife away from her.  Patient is very upset during my interview and will not answer all my questions.  The patient states that she has once to go home. The patient denies chest pain, shortness of breath, headache,blurred vision, neck pain, fever, cough, weakness, numbness, dizziness, anorexia, edema, abdominal pain, nausea, vomiting, diarrhea, rash, back pain, dysuria, hematemesis, bloody stool, near syncope, or syncope. Past Medical History:  Diagnosis Date  . Medical history non-contributory     There are no active problems to display for this patient.   Past Surgical History:  Procedure Laterality Date  . NO PAST SURGERIES      OB History    Gravida Para Term Preterm AB Living   3       3 0   SAB TAB Ectopic Multiple Live Births   2 1             Home Medications    Prior to Admission medications   Medication Sig Start Date End Date Taking? Authorizing Provider  diphenhydramine-acetaminophen (TYLENOL PM) 25-500 MG TABS tablet Take 2 tablets by mouth at bedtime as needed (sleep).   Yes [provider]  benzonatate (TESSALON) 100 MG capsule Take 1 capsule (100 mg total) by mouth every 8 (eight) hours. Patient not taking: Reported on 03/26/2017 09/29/16   Audry PiliMohr, Tyler, PA-C  naproxen (NAPROSYN) 500 MG tablet Take 1 tablet (500 mg total) by mouth 2 (two) times daily. Patient not taking: Reported on 03/26/2017 01/21/16   Antony MaduraHumes, Kelly, PA-C  ondansetron (ZOFRAN) 4 MG tablet Take 1 tablet (4 mg total) by mouth every 6 (six) hours. Patient not taking: Reported on 03/26/2017  09/29/16   Audry PiliMohr, Tyler, PA-C    Family History History reviewed. No pertinent family history.  Social History Social History  Substance Use Topics  . Smoking status: Former Smoker    Types: Cigarettes    Quit date: 12/22/2015  . Smokeless tobacco: Never Used  . Alcohol use Yes     Allergies   Patient has no known allergies.   Review of Systems Review of Systems All other systems negative except as documented in the HPI. All pertinent positives and negatives as reviewed in the HPI.  Physical Exam Updated Vital Signs BP 116/73   Pulse (!) 106   Temp 98.6 F (37 C) (Oral)   Resp 20   Ht 5\' 3"  (1.6 m)   Wt 63.5 kg (140 lb)   LMP  (LMP Unknown)   SpO2 98%   BMI 24.80 kg/m   Physical Exam  Constitutional: She is oriented to person, place, and time. She appears well-developed and well-nourished. No distress.  HENT:  Head: Normocephalic and atraumatic.  Mouth/Throat: Oropharynx is clear and moist.  Eyes: Pupils are equal, round, and reactive to light.  Neck: Normal range of motion. Neck supple.  Cardiovascular: Normal rate, regular rhythm and normal heart sounds.  Exam reveals no gallop and no friction rub.   No murmur heard. Pulmonary/Chest: Effort normal and breath sounds normal. No respiratory distress. She has  no wheezes.  Abdominal: Soft. Bowel sounds are normal. She exhibits no distension. There is no tenderness.  Neurological: She is alert and oriented to person, place, and time. She exhibits normal muscle tone. Coordination normal.  Skin: Skin is warm and dry. Capillary refill takes less than 2 seconds. No rash noted. No erythema.  Psychiatric: Her mood appears anxious. She is agitated. She expresses suicidal ideation.  Nursing note and vitals reviewed.    ED Treatments / Results  Labs (all labs ordered are listed, but only abnormal results are displayed) Labs Reviewed  COMPREHENSIVE METABOLIC PANEL - Abnormal; Notable for the following:       Result Value     Glucose, Bld 132 (*)    Total Protein 8.8 (*)    All other components within normal limits  ACETAMINOPHEN LEVEL - Abnormal; Notable for the following:    Acetaminophen (Tylenol), Serum 49 (*)    All other components within normal limits  CBC - Abnormal; Notable for the following:    WBC 13.3 (*)    All other components within normal limits  RAPID URINE DRUG SCREEN, HOSP PERFORMED - Abnormal; Notable for the following:    Tetrahydrocannabinol POSITIVE (*)    All other components within normal limits  ACETAMINOPHEN LEVEL - Abnormal; Notable for the following:    Acetaminophen (Tylenol), Serum <10 (*)    All other components within normal limits  ETHANOL  SALICYLATE LEVEL  I-STAT BETA HCG BLOOD, ED ([facility], [facility], [facility] ONLY)    EKG  EKG Interpretation None       Radiology No results found.  Procedures Procedures (including critical care time)  Medications Ordered in ED Medications  ibuprofen (ADVIL,MOTRIN) tablet 600 mg (600 mg Oral Given 03/26/17 2002)  alum & mag hydroxide-simeth (MAALOX/MYLANTA) 200-200-20 MG/5ML suspension 30 mL (not administered)  ondansetron (ZOFRAN) tablet 4 mg (not administered)  LORazepam (ATIVAN) tablet 1 mg (1 mg Oral Given 03/26/17 2206)     Initial Impression / Assessment and Plan / ED Course  I have reviewed the triage vital signs and the nursing notes.  Pertinent labs & imaging results that were available during my care of the patient were reviewed by me and considered in my medical decision making (see chart for details).    Patient will need TTS assessment for suicidal ideation and depression   Final Clinical Impressions(s) / ED Diagnoses   Final diagnoses:  None    New Prescriptions New Prescriptions   No medications on file     Charlestine Night, PA-C 03/27/17 0044    Little, Ambrose Finland, MD 03/27/17 1601

## 2017-03-27 NOTE — Care Management (Signed)
Discussed on rounds patient needing GYN follow up, patient has not had follow since miscarriage. [facility] Office will notify patient to schedule appointment.

## 2017-03-27 NOTE — BH Assessment (Addendum)
[facility] Assessment Progress Note  Per Mojeed Akintayo, MD, this pt does not require psychiatric hospitalization at this time.  Pt presents under IVC initiated by law enforcement, which Dr Akintayo has rescinded.  Pt is to be discharged from [facility] with recommendation to follow up with [facility].  This has been included in pt's discharge instructions.  Pt's nurse has been notified.  Kortlyn Koltz, MA Triage Specialist [phone]     

## 2017-03-27 NOTE — ED Notes (Signed)
No reaction to medication noted resting in bed with eyes closed no respiratory or acute distress noted hospital sitter watching pt.

## 2017-03-27 NOTE — ED Notes (Signed)
Patient verbalized understanding of discharge information and resources to follow up. She is ambulatory at discharge without SI. Family here for transportation.

## 2017-03-27 NOTE — [facility] Suicide Risk Assessment (Signed)
Suicide Risk Assessment  Discharge Assessment   [facility]   Principal Problem: Major depressive disorder, recurrent episode, moderate ([facility]) Discharge Diagnoses:  Patient Active Problem List   Diagnosis Date Noted  . Major depressive disorder, recurrent episode, moderate ([facility]) [F33.1] 03/27/2017    Priority: High    Total Time spent with patient: 45 minutes   Musculoskeletal: Strength & Muscle Tone: within normal limits Gait & Station: normal Patient leans: N/A  Psychiatric Specialty Exam: Physical Exam  Constitutional: She is oriented to person, place, and time. She appears well-developed and well-nourished.  HENT:  Head: Normocephalic.  Neck: Normal range of motion.  Respiratory: Effort normal.  Musculoskeletal: Normal range of motion.  Neurological: She is alert and oriented to person, place, and time.  Psychiatric: Her speech is normal and behavior is normal. Judgment and thought content normal. Cognition and memory are normal. She exhibits a depressed mood.    Review of Systems  Psychiatric/Behavioral: Positive for depression.  All other systems reviewed and are negative.   Blood pressure 126/66, pulse 97, temperature 98.7 F (37.1 C), temperature source Oral, resp. rate 18, height 5\' 3"  (1.6 m), weight 63.5 kg (140 lb), SpO2 100 %.Body mass index is 24.8 kg/m.  General Appearance: Casual  Eye Contact:  Good  Speech:  Normal Rate  Volume:  Normal  Mood:  Depressed  Affect:  Congruent  Thought Process:  Coherent and Descriptions of Associations: Intact  Orientation:  Full (Time, Place, and Person)  Thought Content:  WDL and Logical  Suicidal Thoughts:  No  Homicidal Thoughts:  No  Memory:  Immediate;   Good Recent;   Good Remote;   Good  Judgement:  Fair  Insight:  Good  Psychomotor Activity:  Normal  Concentration:  Concentration: Good and Attention Span: Good  Recall:  Good  Fund of Knowledge:  Good  Language:  Good   Akathisia:  No  Handed:  Right  AIMS (if indicated):     Assets:  Housing Leisure Time Physical Health Resilience Social Support  ADL's:  Intact  Cognition:  WNL  Sleep:       Mental Status Per Nursing Assessment::   On Admission:   depression   Demographic Factors:  Adolescent or young adult  Loss Factors: NA  Historical Factors: NA  Risk Reduction Factors:   Sense of responsibility to family, Employed, Living with another person, especially a relative and Positive social support  Continued Clinical Symptoms:  Depression, mild to moderate  Cognitive Features That Contribute To Risk:  None    Suicide Risk:  Minimal: No identifiable suicidal ideation.  Patients presenting with no risk factors but with morbid ruminations; may be classified as minimal risk based on the severity of the depressive symptoms  Follow-up Information    [facility]   Specialty:  Obstetrics and Gynecology Why:  Follow up GYN appointment. Office will call to schedule appointment Contact information: [address] [place] [id number] [phone]          Plan Of Care/Follow-up recommendations:  Activity:  as tolerated heart healthy diet Diet:  heart healthy diet  Jamilynn Whitacre, NP 03/27/2017, 1:49 PM

## 2017-05-02 ENCOUNTER — Encounter: Payer: Medicaid Other | Admitting: Obstetrics and Gynecology

## 2017-05-02 ENCOUNTER — Encounter: Payer: Self-pay | Admitting: *Deleted

## 2017-05-02 NOTE — Progress Notes (Signed)
Patient no showed for visit. Per Venia CarbonJennifer Rasch, NP patient may reschedule as needed.

## 2017-07-26 ENCOUNTER — Encounter (HOSPITAL_COMMUNITY): Payer: Self-pay | Admitting: *Deleted

## 2017-07-26 ENCOUNTER — Inpatient Hospital Stay (HOSPITAL_COMMUNITY)
Admission: AD | Admit: 2017-07-26 | Discharge: 2017-07-26 | Disposition: A | Discharge status: Home / Self Care | Payer: Medicaid Other | Source: Ambulatory Visit | Attending: Obstetrics & Gynecology | Admitting: Obstetrics & Gynecology

## 2017-07-26 DIAGNOSIS — Z3201 Encounter for pregnancy test, result positive: Secondary | ICD-10-CM | POA: Diagnosis present

## 2017-07-26 DIAGNOSIS — Z3A01 Less than 8 weeks gestation of pregnancy: Secondary | ICD-10-CM

## 2017-07-26 DIAGNOSIS — O219 Vomiting of pregnancy, unspecified: Secondary | ICD-10-CM | POA: Diagnosis not present

## 2017-07-26 LAB — POCT PREGNANCY, URINE: Preg Test, Ur: POSITIVE — AB

## 2017-07-26 LAB — URINALYSIS, ROUTINE W REFLEX MICROSCOPIC
BILIRUBIN URINE: NEGATIVE
Bacteria, UA: NONE SEEN
GLUCOSE, UA: NEGATIVE mg/dL
HGB URINE DIPSTICK: NEGATIVE
KETONES UR: 80 mg/dL — AB
LEUKOCYTES UA: NEGATIVE
NITRITE: NEGATIVE
PH: 6 (ref 5.0–8.0)
Protein, ur: 100 mg/dL — AB
Specific Gravity, Urine: 1.032 — ABNORMAL HIGH (ref 1.005–1.030)

## 2017-07-26 MED ORDER — PROMETHAZINE HCL 25 MG PO TABS: 25.0000 mg | ORAL_TABLET | Freq: Four times a day (QID) | ORAL | 3 refills | Status: DC | PRN

## 2017-07-26 NOTE — MAU Note (Signed)
Pt reports nausea and she missed her period. Denies bleeding.

## 2017-07-26 NOTE — MAU Provider Note (Signed)
History     CSN: [id number]  Arrival date and time: 07/26/17 1342   First Provider Initiated Contact with Patient 07/26/17 1510      Chief Complaint  Patient presents with  . Nausea  . Possible Pregnancy   HPI Allison Hamilton is a 21 y.o. G4P0030 at 3620w4d who presents with nausea. She states she missed her period in October and she's been having nausea since then. She states she throws up 1-2 times each morning. She denies any abdominal pain, vaginal bleeding or discharge.   OB History    Gravida Para Term Preterm AB Living   4       3 0   SAB TAB Ectopic Multiple Live Births   2 1            Past Medical History:  Diagnosis Date  . Medical history non-contributory     Past Surgical History:  Procedure Laterality Date  . NO PAST SURGERIES      No family history on file.  Social History   Tobacco Use  . Smoking status: Former Smoker    Types: Cigarettes    Last attempt to quit: 12/22/2015    Years since quitting: 1.5  . Smokeless tobacco: Never Used  Substance Use Topics  . Alcohol use: Yes  . Drug use: No    Allergies: No Known Allergies  Medications Prior to Admission  Medication Sig Dispense Refill Last Dose  . benzonatate (TESSALON) 100 MG capsule Take 1 capsule (100 mg total) by mouth every 8 (eight) hours. (Patient not taking: Reported on 03/26/2017) 21 capsule 0 Not Taking at Unknown time  . diphenhydramine-acetaminophen (TYLENOL PM) 25-500 MG TABS tablet Take 2 tablets by mouth at bedtime as needed (sleep).   03/26/2017 at Unknown time  . naproxen (NAPROSYN) 500 MG tablet Take 1 tablet (500 mg total) by mouth 2 (two) times daily. (Patient not taking: Reported on 03/26/2017) 30 tablet 0 Not Taking at Unknown time  . ondansetron (ZOFRAN) 4 MG tablet Take 1 tablet (4 mg total) by mouth every 6 (six) hours. (Patient not taking: Reported on 03/26/2017) 12 tablet 0 Not Taking at Unknown time    Review of Systems  Constitutional: Negative.  Negative for fatigue and  fever.  HENT: Negative.   Respiratory: Negative.  Negative for shortness of breath.   Cardiovascular: Negative.  Negative for chest pain.  Gastrointestinal: Positive for nausea and vomiting. Negative for abdominal pain, constipation and diarrhea.  Genitourinary: Negative.  Negative for dysuria and vaginal bleeding.  Neurological: Negative.  Negative for dizziness and headaches.   Physical Exam   Blood pressure (!) 124/56, pulse 83, temperature 98.3 F (36.8 C), temperature source Oral, resp. rate 16, height 5' 3.5" (1.613 m), weight 131 lb (59.4 kg), last menstrual period 06/03/2017, SpO2 99 %.  Physical Exam  Nursing note and vitals reviewed. Constitutional: She is oriented to person, place, and time. She appears well-developed and well-nourished. No distress.  HENT:  Head: Normocephalic.  Eyes: Pupils are equal, round, and reactive to light.  Cardiovascular: Normal rate, regular rhythm and normal heart sounds.  Respiratory: Effort normal and breath sounds normal. No respiratory distress.  GI: Soft. Bowel sounds are normal. She exhibits no distension. There is no tenderness.  Neurological: She is alert and oriented to person, place, and time.  Skin: Skin is warm and dry.  Psychiatric: She has a normal mood and affect. Her behavior is normal. Judgment and thought content normal.    MAU Course  Procedures Results for orders placed or performed during the hospital encounter of 07/26/17 (from the past 24 hour(s))  Pregnancy, urine POC     Status: Abnormal   Collection Time: 07/26/17  2:55 PM  Result Value Ref Range   Preg Test, Ur POSITIVE (A) NEGATIVE   MDM UPT  Assessment and Plan   1. Positive pregnancy test   2. Vomiting or nausea of pregnancy   3. [redacted] weeks gestation of pregnancy    -Discharge home in stable condition -Rx for promethazine sent to patient's pharmacy -First trimester precautions discussed -Patient advised to follow-up with OB of choice for prenatal care  ASAP, list given. -Patient may return to MAU as needed or if her condition were to change or worsen  Rolm BookbinderCaroline M Avina Eberle CNM 07/26/2017, 3:17 PM   Allergies as of 07/26/2017   No Known Allergies     Medication List    STOP taking these medications   benzonatate 100 MG capsule Commonly known as:  TESSALON   naproxen 500 MG tablet Commonly known as:  NAPROSYN   ondansetron 4 MG tablet Commonly known as:  ZOFRAN     TAKE these medications   diphenhydramine-acetaminophen 25-500 MG Tabs tablet Commonly known as:  TYLENOL PM Take 2 tablets by mouth at bedtime as needed (sleep).   promethazine 25 MG tablet Commonly known as:  PHENERGAN Take 1 tablet (25 mg total) every 6 (six) hours as needed by mouth for nausea or vomiting.

## 2017-07-26 NOTE — Discharge Instructions (Signed)
First Trimester of Pregnancy °The first trimester of pregnancy is from week 1 until the end of week 13 (months 1 through 3). A week after a sperm fertilizes an egg, the egg will implant on the wall of the uterus. This embryo will begin to develop into a baby. Genes from you and your partner will form the baby. The female genes will determine whether the baby will be a boy or a girl. At 6-8 weeks, the eyes and face will be formed, and the heartbeat can be seen on ultrasound. At the end of 12 weeks, all the baby's organs will be formed. °Now that you are pregnant, you will want to do everything you can to have a healthy baby. Two of the most important things are to get good prenatal care and to follow your health care provider's instructions. Prenatal care is all the medical care you receive before the baby's birth. This care will help prevent, find, and treat any problems during the pregnancy and childbirth. °Body changes during your first trimester °Your body goes through many changes during pregnancy. The changes vary from woman to woman. °· You may gain or lose a couple of pounds at first. °· You may feel sick to your stomach (nauseous) and you may throw up (vomit). If the vomiting is uncontrollable, call your health care provider. °· You may tire easily. °· You may develop headaches that can be relieved by medicines. All medicines should be approved by your health care provider. °· You may urinate more often. Painful urination may mean you have a bladder infection. °· You may develop heartburn as a result of your pregnancy. °· You may develop constipation because certain hormones are causing the muscles that push stool through your intestines to slow down. °· You may develop hemorrhoids or swollen veins (varicose veins). °· Your breasts may begin to grow larger and become tender. Your nipples may stick out more, and the tissue that surrounds them (areola) may become darker. °· Your gums may bleed and may be  sensitive to brushing and flossing. °· Dark spots or blotches (chloasma, mask of pregnancy) may develop on your face. This will likely fade after the baby is born. °· Your menstrual periods will stop. °· You may have a loss of appetite. °· You may develop cravings for certain kinds of food. °· You may have changes in your emotions from day to day, such as being excited to be pregnant or being concerned that something may go wrong with the pregnancy and baby. °· You may have more vivid and strange dreams. °· You may have changes in your hair. These can include thickening of your hair, rapid growth, and changes in texture. Some women also have hair loss during or after pregnancy, or hair that feels dry or thin. Your hair will most likely return to normal after your baby is born. ° °What to expect at prenatal visits °During a routine prenatal visit: °· You will be weighed to make sure you and the baby are growing normally. °· Your blood pressure will be taken. °· Your abdomen will be measured to track your baby's growth. °· The fetal heartbeat will be listened to between weeks 10 and 14 of your pregnancy. °· Test results from any previous visits will be discussed. ° °Your health care provider may ask you: °· How you are feeling. °· If you are feeling the baby move. °· If you have had any abnormal symptoms, such as leaking fluid, bleeding, severe headaches,   or abdominal cramping. °· If you are using any tobacco products, including cigarettes, chewing tobacco, and electronic cigarettes. °· If you have any questions. ° °Other tests that may be performed during your first trimester include: °· Blood tests to find your blood type and to check for the presence of any previous infections. The tests will also be used to check for low iron levels (anemia) and protein on red blood cells (Rh antibodies). Depending on your risk factors, or if you previously had diabetes during pregnancy, you may have tests to check for high blood  sugar that affects pregnant women (gestational diabetes). °· Urine tests to check for infections, diabetes, or protein in the urine. °· An ultrasound to confirm the proper growth and development of the baby. °· Fetal screens for spinal cord problems (spina bifida) and Down syndrome. °· HIV (human immunodeficiency virus) testing. Routine prenatal testing includes screening for HIV, unless you choose not to have this test. °· You may need other tests to make sure you and the baby are doing well. ° °Follow these instructions at home: °Medicines °· Follow your health care provider's instructions regarding medicine use. Specific medicines may be either safe or unsafe to take during pregnancy. °· Take a prenatal vitamin that contains at least 600 micrograms (mcg) of folic acid. °· If you develop constipation, try taking a stool softener if your health care provider approves. °Eating and drinking °· Eat a balanced diet that includes fresh fruits and vegetables, whole grains, good sources of protein such as meat, eggs, or tofu, and low-fat dairy. Your health care provider will help you determine the amount of weight gain that is right for you. °· Avoid raw meat and uncooked cheese. These carry germs that can cause birth defects in the baby. °· Eating four or five small meals rather than three large meals a day may help relieve nausea and vomiting. If you start to feel nauseous, eating a few soda crackers can be helpful. Drinking liquids between meals, instead of during meals, also seems to help ease nausea and vomiting. °· Limit foods that are high in fat and processed sugars, such as fried and sweet foods. °· To prevent constipation: °? Eat foods that are high in fiber, such as fresh fruits and vegetables, whole grains, and beans. °? Drink enough fluid to keep your urine clear or pale yellow. °Activity °· Exercise only as directed by your health care provider. Most women can continue their usual exercise routine during  pregnancy. Try to exercise for 30 minutes at least 5 days a week. Exercising will help you: °? Control your weight. °? Stay in shape. °? Be prepared for labor and delivery. °· Experiencing pain or cramping in the lower abdomen or lower back is a good sign that you should stop exercising. Check with your health care provider before continuing with normal exercises. °· Try to avoid standing for long periods of time. Move your legs often if you must stand in one place for a long time. °· Avoid heavy lifting. °· Wear low-heeled shoes and practice good posture. °· You may continue to have sex unless your health care provider tells you not to. °Relieving pain and discomfort °· Wear a good support bra to relieve breast tenderness. °· Take warm sitz baths to soothe any pain or discomfort caused by hemorrhoids. Use hemorrhoid cream if your health care provider approves. °· Rest with your legs elevated if you have leg cramps or low back pain. °· If you develop   varicose veins in your legs, wear support hose. Elevate your feet for 15 minutes, 3-4 times a day. Limit salt in your diet. Prenatal care  Schedule your prenatal visits by the twelfth week of pregnancy. They are usually scheduled monthly at first, then more often in the last 2 months before delivery.  Write down your questions. Take them to your prenatal visits.  Keep all your prenatal visits as told by your health care provider. This is important. Safety  Wear your seat belt at all times when driving.  Make a list of emergency phone numbers, including numbers for family, friends, the hospital, and police and fire departments. General instructions  Ask your health care provider for a referral to a local prenatal education class. Begin classes no later than the beginning of month 6 of your pregnancy.  Ask for help if you have counseling or nutritional needs during pregnancy. Your health care provider can offer advice or refer you to specialists for help  with various needs.  Do not use hot tubs, steam rooms, or saunas.  Do not douche or use tampons or scented sanitary pads.  Do not cross your legs for long periods of time.  Avoid cat litter boxes and soil used by cats. These carry germs that can cause birth defects in the baby and possibly loss of the fetus by miscarriage or stillbirth.  Avoid all smoking, herbs, alcohol, and medicines not prescribed by your health care provider. Chemicals in these products affect the formation and growth of the baby.  Do not use any products that contain nicotine or tobacco, such as cigarettes and e-cigarettes. If you need help quitting, ask your health care provider. You may receive counseling support and other resources to help you quit.  Schedule a dentist appointment. At home, brush your teeth with a soft toothbrush and be gentle when you floss. Contact a health care provider if:  You have dizziness.  You have mild pelvic cramps, pelvic pressure, or nagging pain in the abdominal area.  You have persistent nausea, vomiting, or diarrhea.  You have a bad smelling vaginal discharge.  You have pain when you urinate.  You notice increased swelling in your face, hands, legs, or ankles.  You are exposed to fifth disease or chickenpox.  You are exposed to [place] measles (rubella) and have never had it. Get help right away if:  You have a fever.  You are leaking fluid from your vagina.  You have spotting or bleeding from your vagina.  You have severe abdominal cramping or pain.  You have rapid weight gain or loss.  You vomit blood or material that looks like coffee grounds.  You develop a severe headache.  You have shortness of breath.  You have any kind of trauma, such as from a fall or a car accident. Summary  The first trimester of pregnancy is from week 1 until the end of week 13 (months 1 through 3).  Your body goes through many changes during pregnancy. The changes vary from  woman to woman.  You will have routine prenatal visits. During those visits, your health care provider will examine you, discuss any test results you may have, and talk with you about how you are feeling. This information is not intended to replace advice given to you by your health care provider. Make sure you discuss any questions you have with your health care provider. Document Released: 08/30/2001 Document Revised: 08/17/2016 Document Reviewed: 08/17/2016 Elsevier Interactive Patient Education  2017 Elsevier  Inc. ° °[facility]  ° ° °[facility]       Phone: [phone] ° °[facility] [place] Phone: [phone] ° °[facility] [place]  Phone: [phone] ° °[facility] [place]  Phone: [phone] ° °[facility] [place]  Phone: [phone] ° °[facility]       Phone: [phone] ° °[facility]    Phone: [phone]  ° °[facility] ([place]    Phone: [phone] ° °[facility]    Phone: [phone] ° °[facility]    Phone: [phone] ° °[facility]    Phone: [phone] ° °[facility]       Phone: [phone]  ° °[facility]  Phone: [phone] ° °[facility]    Phone: [phone] ° °[facility]   Phone: [phone] ° °[facility]      Phone: [phone] ° °[facility]    Phone: [phone] ° °Safe Medications in Pregnancy  ° °Acne: °Benzoyl Peroxide °Salicylic Acid ° °Backache/Headache: °Tylenol: 2 regular strength every 4 hours OR °             2 Extra strength every 6 hours ° °Colds/Coughs/Allergies: °Benadryl (alcohol free) 25 mg every 6 hours as needed °Breath right  strips °Claritin °Cepacol throat lozenges °Chloraseptic throat spray °Cold-Eeze- up to three times per day °Cough drops, alcohol free °Flonase (by prescription only) °Guaifenesin °Mucinex °Robitussin DM (plain only, alcohol free) °Saline nasal spray/drops °Sudafed (pseudoephedrine) & Actifed ** use only after [redacted] weeks gestation and if you do not have high blood pressure °Tylenol °Vicks Vaporub °Zinc lozenges °Zyrtec  ° °Constipation: °Colace °Ducolax suppositories °Fleet enema °Glycerin suppositories °Metamucil °Milk of magnesia °Miralax °Senokot °Smooth move tea ° °Diarrhea: °Kaopectate °Imodium A-D ° °*NO pepto Bismol ° °Hemorrhoids: °Anusol °Anusol HC °Preparation H °Tucks ° °Indigestion: °Tums °Maalox °Mylanta °Zantac  °Pepcid ° °Insomnia: °Benadryl (alcohol free) 25mg every 6 hours as needed °Tylenol PM °Unisom, no Gelcaps ° °Leg Cramps: °Tums °MagGel ° °Nausea/Vomiting:  °Bonine °Dramamine °Emetrol °Ginger extract °Sea bands °Meclizine  °Nausea medication to take during pregnancy:  °Unisom (doxylamine succinate 25 mg tablets) Take one tablet daily at bedtime. If symptoms are not adequately controlled, the dose can be increased to a maximum recommended dose of two tablets daily (1/2 tablet in the morning, 1/2 tablet mid-afternoon and one at bedtime). °Vitamin B6 100mg tablets. Take one tablet twice a day (up to 200 mg per day). ° °Skin Rashes: °Aveeno products °Benadryl cream or 25mg every 6 hours as needed °Calamine Lotion °1% cortisone cream ° °Yeast infection: °Gyne-lotrimin 7 °Monistat 7 ° ° °**If taking multiple medications, please check labels to avoid duplicating the same active ingredients °**take medication as directed on the label °** Do not exceed 4000 mg of tylenol in 24 hours °**Do not take medications that contain aspirin or ibuprofen ° ° ° ° ° °

## 2017-08-21 ENCOUNTER — Ambulatory Visit (INDEPENDENT_AMBULATORY_CARE_PROVIDER_SITE_OTHER): Payer: Medicaid Other | Admitting: Certified Nurse Midwife

## 2017-08-21 ENCOUNTER — Other Ambulatory Visit (HOSPITAL_COMMUNITY)
Admission: RE | Admit: 2017-08-21 | Discharge: 2017-08-21 | Disposition: A | Discharge status: Home / Self Care | Payer: Medicaid Other | Source: Ambulatory Visit | Attending: Certified Nurse Midwife | Admitting: Certified Nurse Midwife

## 2017-08-21 ENCOUNTER — Encounter: Payer: Self-pay | Admitting: Certified Nurse Midwife

## 2017-08-21 VITALS — BP 126/78 | HR 112 | Wt 131.4 lb

## 2017-08-21 DIAGNOSIS — Z3480 Encounter for supervision of other normal pregnancy, unspecified trimester: Secondary | ICD-10-CM | POA: Diagnosis present

## 2017-08-21 DIAGNOSIS — B373 Candidiasis of vulva and vagina: Secondary | ICD-10-CM | POA: Diagnosis not present

## 2017-08-21 DIAGNOSIS — B9689 Other specified bacterial agents as the cause of diseases classified elsewhere: Secondary | ICD-10-CM | POA: Diagnosis not present

## 2017-08-21 DIAGNOSIS — Z3481 Encounter for supervision of other normal pregnancy, first trimester: Secondary | ICD-10-CM

## 2017-08-21 DIAGNOSIS — Z8279 Family history of other congenital malformations, deformations and chromosomal abnormalities: Secondary | ICD-10-CM

## 2017-08-21 DIAGNOSIS — Z8759 Personal history of other complications of pregnancy, childbirth and the puerperium: Secondary | ICD-10-CM

## 2017-08-21 DIAGNOSIS — O219 Vomiting of pregnancy, unspecified: Secondary | ICD-10-CM

## 2017-08-21 MED ORDER — DOXYLAMINE-PYRIDOXINE 10-10 MG PO TBEC: DELAYED_RELEASE_TABLET | ORAL | 4 refills | Status: DC

## 2017-08-21 MED ORDER — PRENATE PIXIE 10-0.6-0.4-200 MG PO CAPS: 1.0000 | ORAL_CAPSULE | Freq: Every day | ORAL | 12 refills | Status: DC

## 2017-08-21 NOTE — Progress Notes (Signed)
Patient is in the office for initial ob visit, reports occasional uterine irritability.

## 2017-08-22 LAB — CERVICOVAGINAL ANCILLARY ONLY
BACTERIAL VAGINITIS: POSITIVE — AB
CANDIDA VAGINITIS: POSITIVE — AB
CHLAMYDIA, DNA PROBE: NEGATIVE
NEISSERIA GONORRHEA: NEGATIVE
Trichomonas: NEGATIVE

## 2017-08-22 NOTE — Progress Notes (Signed)
Subjective:   Michelle NasutiShaniya Kosman is a 21 y.o. G3P0020 at 554w3d by LMP being seen today for her first obstetrical visit.  Her obstetrical history is significant for none. Patient does intend to breast feed. Pregnancy history fully reviewed.  Patient reports nausea, no bleeding, no contractions, no cramping and no leaking.  HISTORY: Obstetric History   G3   P0   T0   P0   A2   L0    SAB2   TAB0   Ectopic0   Multiple0   Live Births0     # Outcome Date GA Lbr Len/2nd Weight Sex Delivery Anes PTL Lv  3 Current           2 SAB 08/2015          1 SAB 04/2015             Past Medical History:  Diagnosis Date  . Medical history non-contributory    Past Surgical History:  Procedure Laterality Date  . NO PAST SURGERIES     History reviewed. No pertinent family history. Social History   Tobacco Use  . Smoking status: Former Smoker    Types: Cigarettes    Last attempt to quit: 07/2017    Years since quitting: 0.0  . Smokeless tobacco: Never Used  Substance Use Topics  . Alcohol use: No    Frequency: Never  . Drug use: No   No Known Allergies Current Outpatient Medications on File Prior to Visit  Medication Sig Dispense Refill  . Prenatal Vit-Fe Fumarate-FA (MULTIVITAMIN-PRENATAL) 27-0.8 MG TABS tablet Take 1 tablet by mouth daily at 12 noon.    . promethazine (PHENERGAN) 25 MG tablet Take 1 tablet (25 mg total) every 6 (six) hours as needed by mouth for nausea or vomiting. 30 tablet 3  . diphenhydramine-acetaminophen (TYLENOL PM) 25-500 MG TABS tablet Take 2 tablets by mouth at bedtime as needed (sleep).     No current facility-administered medications on file prior to visit.      Exam   Vitals:   08/21/17 1353  BP: 126/78  Pulse: (!) 112  Weight: 131 lb 6.4 oz (59.6 kg)   Fetal Heart Rate (bpm): 164; doppler  Uterus:     Pelvic Exam: Perineum: no hemorrhoids, normal perineum   Vulva: normal external genitalia, no lesions   Vagina:  normal mucosa, normal  discharge   Cervix: no lesions and normal, pap smear done.    Adnexa: normal adnexa and no mass, fullness, tenderness   Bony Pelvis: average  System: General: well-developed, well-nourished female in no acute distress   Breast:  normal appearance, no masses or tenderness   Skin: normal coloration and turgor, no rashes   Neurologic: oriented, normal, negative, normal mood   Extremities: normal strength, tone, and muscle mass, ROM of all joints is normal   HEENT PERRLA, extraocular movement intact and sclera clear, anicteric   Mouth/Teeth mucous membranes moist, pharynx normal without lesions and dental hygiene good   Neck supple and no masses   Cardiovascular: regular rate and rhythm   Respiratory:  no respiratory distress, normal breath sounds   Abdomen: soft, non-tender; bowel sounds normal; no masses,  no organomegaly     Assessment:   Pregnancy: [id number] Patient Active Problem List   Diagnosis Date Noted  . Supervision of other normal pregnancy, antepartum 08/21/2017  . Family history of spina bifida 08/21/2017  . History of twin pregnancy in prior pregnancy 08/21/2017  . Major depressive disorder, recurrent  episode, moderate (HCC) 03/27/2017     Plan:  1. Supervision of other normal pregnancy, antepartum      - Cervicovaginal ancillary only - Hemoglobinopathy evaluation - Culture, OB Urine - Vitamin D (25 hydroxy) - HgB A1c - Obstetric Panel, Including HIV - MaterniT21 PLUS Core+SCA - Varicella zoster antibody, IgG - Cystic Fibrosis Mutation 97 - Prenat-FeAsp-Meth-FA-DHA w/o A (PRENATE PIXIE) 10-0.6-0.4-200 MG CAPS; Take 1 tablet by mouth daily.  Dispense: 30 capsule; Refill: 12  2. Family history of spina bifida       3. Nausea/vomiting in pregnancy      - Doxylamine-Pyridoxine (DICLEGIS) 10-10 MG TBEC; Take 1 tablet with breakfast and lunch.  Take 2 tablets at bedtime.  Dispense: 100 tablet; Refill: 4  4. History of twin pregnancy in prior pregnancy     Has US  in chart; singleton pregnancy   Initial labs drawn. Continue prenatal vitamins. Genetic Screening discussed, NIPS: ordered. Ultrasound discussed; fetal anatomic survey: ordered. Problem list reviewed and updated. The nature [facility] with multiple MDs and other Advanced Practice Providers was explained to patient; also emphasized that residents, students are part of our team. Routine obstetric precautions reviewed. Return in about 4 weeks (around 09/18/2017) for ROB.     Orvilla Cornwallachelle Denney, CNM [facility]

## 2017-08-23 ENCOUNTER — Other Ambulatory Visit: Payer: Self-pay | Admitting: Certified Nurse Midwife

## 2017-08-23 DIAGNOSIS — E559 Vitamin D deficiency, unspecified: Secondary | ICD-10-CM

## 2017-08-23 DIAGNOSIS — B9689 Other specified bacterial agents as the cause of diseases classified elsewhere: Secondary | ICD-10-CM

## 2017-08-23 DIAGNOSIS — O09899 Supervision of other high risk pregnancies, unspecified trimester: Secondary | ICD-10-CM

## 2017-08-23 DIAGNOSIS — B373 Candidiasis of vulva and vagina: Secondary | ICD-10-CM

## 2017-08-23 DIAGNOSIS — N760 Acute vaginitis: Principal | ICD-10-CM

## 2017-08-23 DIAGNOSIS — B3731 Acute candidiasis of vulva and vagina: Secondary | ICD-10-CM

## 2017-08-23 DIAGNOSIS — Z3480 Encounter for supervision of other normal pregnancy, unspecified trimester: Secondary | ICD-10-CM

## 2017-08-23 DIAGNOSIS — Z283 Underimmunization status: Secondary | ICD-10-CM

## 2017-08-23 DIAGNOSIS — Z2839 Other underimmunization status: Secondary | ICD-10-CM | POA: Insufficient documentation

## 2017-08-23 LAB — HEMOGLOBINOPATHY EVALUATION
HGB C: 0 %
HGB S: 0 %
HGB VARIANT: 0 %
Hemoglobin A2 Quantitation: 2.4 % (ref 1.8–3.2)
Hemoglobin F Quantitation: 0 % (ref 0.0–2.0)
Hgb A: 97.6 % (ref 96.4–98.8)

## 2017-08-23 LAB — HEMOGLOBIN A1C
ESTIMATED AVERAGE GLUCOSE: 111 mg/dL
HEMOGLOBIN A1C: 5.5 % (ref 4.8–5.6)

## 2017-08-23 LAB — OBSTETRIC PANEL, INCLUDING HIV
ANTIBODY SCREEN: NEGATIVE
BASOS ABS: 0 10*3/uL (ref 0.0–0.2)
BASOS: 0 %
EOS (ABSOLUTE): 0 10*3/uL (ref 0.0–0.4)
Eos: 1 %
HIV SCREEN 4TH GENERATION: NONREACTIVE
Hematocrit: 36.3 % (ref 34.0–46.6)
Hemoglobin: 12.2 g/dL (ref 11.1–15.9)
Hepatitis B Surface Ag: NEGATIVE
IMMATURE GRANS (ABS): 0 10*3/uL (ref 0.0–0.1)
Immature Granulocytes: 0 %
LYMPHS: 24 %
Lymphocytes Absolute: 1.5 10*3/uL (ref 0.7–3.1)
MCH: 28.6 pg (ref 26.6–33.0)
MCHC: 33.6 g/dL (ref 31.5–35.7)
MCV: 85 fL (ref 79–97)
MONOCYTES: 12 %
MONOS ABS: 0.8 10*3/uL (ref 0.1–0.9)
NEUTROS ABS: 4 10*3/uL (ref 1.4–7.0)
Neutrophils: 63 %
PLATELETS: 218 10*3/uL (ref 150–379)
RBC: 4.27 x10E6/uL (ref 3.77–5.28)
RDW: 13.3 % (ref 12.3–15.4)
RPR Ser Ql: NONREACTIVE
RUBELLA: 2.66 {index} (ref >0.99)
Rh Factor: POSITIVE
WBC: 6.4 10*3/uL (ref 3.4–10.8)

## 2017-08-23 LAB — URINE CULTURE, OB REFLEX

## 2017-08-23 LAB — VITAMIN D 25 HYDROXY (VIT D DEFICIENCY, FRACTURES): Vit D, 25-Hydroxy: 19.7 ng/mL — ABNORMAL LOW (ref 30.0–100.0)

## 2017-08-23 LAB — VARICELLA ZOSTER ANTIBODY, IGG

## 2017-08-23 LAB — CULTURE, OB URINE

## 2017-08-23 MED ORDER — VITAMIN D (ERGOCALCIFEROL) 1.25 MG (50000 UNIT) PO CAPS: 50000.0000 [IU] | ORAL_CAPSULE | ORAL | 2 refills | Status: DC

## 2017-08-23 MED ORDER — METRONIDAZOLE 0.75 % VA GEL: 1.0000 | Freq: Two times a day (BID) | VAGINAL | 0 refills | Status: DC

## 2017-08-23 MED ORDER — TERCONAZOLE 0.8 % VA CREA: 1.0000 | TOPICAL_CREAM | Freq: Every day | VAGINAL | 0 refills | Status: DC

## 2017-08-25 ENCOUNTER — Other Ambulatory Visit: Payer: Self-pay | Admitting: Certified Nurse Midwife

## 2017-08-25 DIAGNOSIS — Z3480 Encounter for supervision of other normal pregnancy, unspecified trimester: Secondary | ICD-10-CM

## 2017-08-25 LAB — MATERNIT21 PLUS CORE+SCA
CHROMOSOME 13: NEGATIVE
CHROMOSOME 21: NEGATIVE
Chromosome 18: NEGATIVE
Y CHROMOSOME: DETECTED

## 2017-08-30 ENCOUNTER — Other Ambulatory Visit: Payer: Self-pay | Admitting: Certified Nurse Midwife

## 2017-08-30 DIAGNOSIS — Z3480 Encounter for supervision of other normal pregnancy, unspecified trimester: Secondary | ICD-10-CM

## 2017-08-30 LAB — CYSTIC FIBROSIS MUTATION 97: Interpretation: NOT DETECTED

## 2017-09-19 NOTE — L&D Delivery Note (Signed)
Patient is 22 y.o. [id number] 4071w5d admitted for SOL. Progressed without augmentation. AROM at 0315.  Prenatal course also complicated by none.  GBS neg  Delivery Note At 6:05 AM a viable female was delivered via Vaginal, Spontaneous (Presentation: ROA).  APGAR: 8, 9; weight pending.  Placenta status: Intact.  Cord: 3V with the following complications: None.  Cord pH: N/A  Anesthesia: Epidural Episiotomy: None Lacerations: Sulcus;Labial;1st degree Suture Repair: 3.0 vicryl Est. Blood Loss (mL):  600 - majority from lacerations  Mom to postpartum.  Baby to Couplet care / Skin to Skin.  Caryl AdaJazma Phelps, DO 03/15/2018, 6:45 AM OB Fellow [facility]

## 2017-09-20 ENCOUNTER — Other Ambulatory Visit (HOSPITAL_COMMUNITY)
Admission: RE | Admit: 2017-09-20 | Discharge: 2017-09-20 | Disposition: A | Discharge status: Home / Self Care | Payer: Medicaid Other | Source: Ambulatory Visit | Attending: Nurse Practitioner | Admitting: Nurse Practitioner

## 2017-09-20 ENCOUNTER — Other Ambulatory Visit: Payer: Self-pay

## 2017-09-20 ENCOUNTER — Ambulatory Visit (INDEPENDENT_AMBULATORY_CARE_PROVIDER_SITE_OTHER): Payer: Medicaid Other | Admitting: Nurse Practitioner

## 2017-09-20 VITALS — BP 132/87 | HR 124 | Temp 97.9°F | Wt 129.6 lb

## 2017-09-20 DIAGNOSIS — O98819 Other maternal infectious and parasitic diseases complicating pregnancy, unspecified trimester: Secondary | ICD-10-CM | POA: Insufficient documentation

## 2017-09-20 DIAGNOSIS — B373 Candidiasis of vulva and vagina: Secondary | ICD-10-CM | POA: Insufficient documentation

## 2017-09-20 DIAGNOSIS — Z3482 Encounter for supervision of other normal pregnancy, second trimester: Secondary | ICD-10-CM

## 2017-09-20 DIAGNOSIS — Z3A00 Weeks of gestation of pregnancy not specified: Secondary | ICD-10-CM | POA: Insufficient documentation

## 2017-09-20 DIAGNOSIS — Z3480 Encounter for supervision of other normal pregnancy, unspecified trimester: Secondary | ICD-10-CM

## 2017-09-20 DIAGNOSIS — B3731 Acute candidiasis of vulva and vagina: Secondary | ICD-10-CM | POA: Insufficient documentation

## 2017-09-20 MED ORDER — TERCONAZOLE 0.4 % VA CREA: 1.0000 | TOPICAL_CREAM | Freq: Every day | VAGINAL | 0 refills | Status: DC

## 2017-09-20 NOTE — Progress Notes (Signed)
    Subjective:  Allison NasutiShaniya Hamilton is a 22 y.o. G3P0020 at 2971w4d being seen today for ongoing prenatal care.  She is currently monitored for the following issues for this low-risk pregnancy and has Major depressive disorder, recurrent episode, moderate (HCC); Supervision of other normal pregnancy, antepartum; Family history of spina bifida; History of twin pregnancy in prior pregnancy; Vitamin D deficiency; Maternal varicella, non-immune; and Vaginal yeast infection on their problem list.  Patient reports vaginal irritation and periodic abdominal pain.  Contractions: Not present. Vag. Bleeding: None.    Denies leaking of fluid.   The following portions of the patient's history were reviewed and updated as appropriate: allergies, current medications, past family history, past medical history, past social history, past surgical history and problem list. Problem list updated.  Objective:   Vitals:   09/20/17 1405  BP: 132/87  Pulse: (!) 124  Temp: 97.9 F (36.6 C)  Weight: 129 lb 9.6 oz (58.8 kg)    Fetal Status: Fetal Heart Rate (bpm): 148; doppler Fundal Height: 15 cm       General:  Alert, oriented and cooperative. Patient is in no acute distress.  Skin: Skin is warm and dry. No rash noted.   Cardiovascular: Normal heart rate noted  Respiratory: Normal respiratory effort, no problems with respiration noted  Abdomen: Soft, gravid, appropriate for gestational age. Pain/Pressure: Present     Pelvic:  Cervical exam deferred        Extremities: Normal range of motion.  Edema: None  Mental Status: Normal mood and affect. Normal behavior. Normal judgment and thought content.   Urinalysis:      Assessment and Plan:  Pregnancy: G3P0020 at 7571w4d  1. Supervision of other normal pregnancy, antepartum Continues to have nausea - advised to try to pick up Diclegis again.  Still awaiting newest Medicaid card but has been in touch with her worker and is awaiting card.  Taking Phenergan  periodically.  Is avoiding eating but advised to eat even if not hungry - had a hamburger yesterday and did not vomit.  Is not having contractions and did not check cervix.  Noted tachycardia - has not eaten today. - AFP, Serum, Open Spina Bifida - Cervicovaginal ancillary only - US MFM OB COMP + 14 WK; Future  2. Vaginal yeast infection Continues to have some vaginal discharge similar to what she had with the yeast infection.  Dose not think it has cleared.  Viewed vaginal opening - internal erythema with white grainy adherent discharge seen.  Likely yeast infection is persisting as she also had a week of Flagyl for BV.  Prescribed 7 days of Terazol.  Preterm labor symptoms and general obstetric precautions including but not limited to vaginal bleeding, contractions, leaking of fluid and fetal movement were reviewed in detail with the patient. Please refer to After Visit Summary for other counseling recommendations.  Return in about 4 weeks (around 10/18/2017).  Nolene BernheimERRI BURLESON, RN, MSN, NP-BC Nurse Practitioner, [facility], [facility] 09/20/2017 4:28 PM

## 2017-09-20 NOTE — Patient Instructions (Addendum)

## 2017-09-21 ENCOUNTER — Other Ambulatory Visit: Payer: Self-pay | Admitting: Nurse Practitioner

## 2017-09-21 LAB — CERVICOVAGINAL ANCILLARY ONLY
Bacterial vaginitis: NEGATIVE
Candida vaginitis: POSITIVE — AB
Chlamydia: NEGATIVE
NEISSERIA GONORRHEA: NEGATIVE
TRICH (WINDOWPATH): NEGATIVE

## 2017-09-22 LAB — AFP, SERUM, OPEN SPINA BIFIDA
AFP MoM: 1.29
AFP Value: 44.8 ng/mL
Gest. Age on Collection Date: 15.4 weeks
MATERNAL AGE AT EDD: 21.2 a
OSBR Risk 1 IN: 9894
TEST RESULTS AFP: NEGATIVE
Weight: 130 [lb_av]

## 2017-10-07 ENCOUNTER — Other Ambulatory Visit: Payer: Self-pay

## 2017-10-07 ENCOUNTER — Inpatient Hospital Stay (HOSPITAL_COMMUNITY)
Admission: AD | Admit: 2017-10-07 | Discharge: 2017-10-08 | Disposition: A | Discharge status: Home / Self Care | Payer: Medicaid Other | Source: Ambulatory Visit | Attending: Obstetrics and Gynecology | Admitting: Obstetrics and Gynecology

## 2017-10-07 ENCOUNTER — Encounter (HOSPITAL_COMMUNITY): Payer: Self-pay | Admitting: *Deleted

## 2017-10-07 DIAGNOSIS — O210 Mild hyperemesis gravidarum: Secondary | ICD-10-CM | POA: Diagnosis not present

## 2017-10-07 DIAGNOSIS — O219 Vomiting of pregnancy, unspecified: Secondary | ICD-10-CM

## 2017-10-07 DIAGNOSIS — O26899 Other specified pregnancy related conditions, unspecified trimester: Secondary | ICD-10-CM

## 2017-10-07 DIAGNOSIS — O26892 Other specified pregnancy related conditions, second trimester: Secondary | ICD-10-CM | POA: Insufficient documentation

## 2017-10-07 DIAGNOSIS — K5900 Constipation, unspecified: Secondary | ICD-10-CM | POA: Insufficient documentation

## 2017-10-07 DIAGNOSIS — Z87891 Personal history of nicotine dependence: Secondary | ICD-10-CM | POA: Diagnosis not present

## 2017-10-07 DIAGNOSIS — R109 Unspecified abdominal pain: Secondary | ICD-10-CM | POA: Diagnosis present

## 2017-10-07 DIAGNOSIS — O99612 Diseases of the digestive system complicating pregnancy, second trimester: Secondary | ICD-10-CM | POA: Diagnosis not present

## 2017-10-07 DIAGNOSIS — Z3A18 18 weeks gestation of pregnancy: Secondary | ICD-10-CM | POA: Insufficient documentation

## 2017-10-07 LAB — URINALYSIS, ROUTINE W REFLEX MICROSCOPIC
BILIRUBIN URINE: NEGATIVE
GLUCOSE, UA: NEGATIVE mg/dL
HGB URINE DIPSTICK: NEGATIVE
KETONES UR: 20 mg/dL — AB
LEUKOCYTES UA: NEGATIVE
NITRITE: NEGATIVE
PH: 7 (ref 5.0–8.0)
Protein, ur: 30 mg/dL — AB
Specific Gravity, Urine: 1.023 (ref 1.005–1.030)

## 2017-10-07 NOTE — MAU Note (Addendum)
Have not been feeling well. Pain in lower abd since 1500 that is worse with movement. "Feels like my body cramps up". Denies LOF or bleeding. Pain is sharp. Normal BM today but sometimes constipated. Some nausea but is not new

## 2017-10-08 ENCOUNTER — Encounter (HOSPITAL_COMMUNITY): Payer: Self-pay | Admitting: Advanced Practice Midwife

## 2017-10-08 ENCOUNTER — Inpatient Hospital Stay (HOSPITAL_COMMUNITY): Payer: Medicaid Other

## 2017-10-08 DIAGNOSIS — K5900 Constipation, unspecified: Secondary | ICD-10-CM

## 2017-10-08 DIAGNOSIS — O99612 Diseases of the digestive system complicating pregnancy, second trimester: Secondary | ICD-10-CM | POA: Diagnosis not present

## 2017-10-08 DIAGNOSIS — R109 Unspecified abdominal pain: Secondary | ICD-10-CM

## 2017-10-08 DIAGNOSIS — O219 Vomiting of pregnancy, unspecified: Secondary | ICD-10-CM | POA: Diagnosis not present

## 2017-10-08 DIAGNOSIS — Z3A18 18 weeks gestation of pregnancy: Secondary | ICD-10-CM | POA: Diagnosis not present

## 2017-10-08 MED ORDER — IBUPROFEN 600 MG PO TABS
600.0000 mg | ORAL_TABLET | Freq: Once | ORAL | Status: AC | PRN
  Administered 2017-10-08: 600 mg via ORAL
  Filled 2017-10-08: qty 1

## 2017-10-08 MED ORDER — POLYETHYLENE GLYCOL 3350 17 GM/SCOOP PO POWD: 17.0000 g | Freq: Every day | ORAL | 2 refills | Status: DC | PRN

## 2017-10-08 MED ORDER — PROMETHAZINE HCL 25 MG PO TABS: 25.0000 mg | ORAL_TABLET | Freq: Four times a day (QID) | ORAL | 3 refills | Status: DC | PRN

## 2017-10-08 MED ORDER — ONDANSETRON HCL 4 MG PO TABS
8.0000 mg | ORAL_TABLET | Freq: Once | ORAL | Status: AC
  Administered 2017-10-08: 8 mg via ORAL
  Filled 2017-10-08: qty 2

## 2017-10-08 NOTE — Discharge Instructions (Signed)
Abdominal Pain During Pregnancy °Abdominal pain is common in pregnancy. Most of the time, it does not cause harm. There are many causes of abdominal pain. Some causes are more serious than others and sometimes the cause is not known. Abdominal pain can be a sign that something is very wrong with the pregnancy or the pain may have nothing to do with the pregnancy. Always tell your health care provider if you have any abdominal pain. °Follow these instructions at home: °· Do not have sex or put anything in your vagina until your symptoms go away completely. °· Watch your abdominal pain for any changes. °· Get plenty of rest until your pain improves. °· Drink enough fluid to keep your urine clear or pale yellow. °· Take over-the-counter or prescription medicines only as told by your health care provider. °· Keep all follow-up visits as told by your health care provider. This is important. °Contact a health care provider if: °· You have a fever. °· Your pain gets worse or you have cramping. °· Your pain continues after resting. °Get help right away if: °· You are bleeding, leaking fluid, or passing tissue from the vagina. °· You have vomiting or diarrhea that does not go away. °· You have painful or bloody urination. °· You notice a decrease in your baby's movements. °· You feel very weak or faint. °· You have shortness of breath. °· You develop a severe headache with abdominal pain. °· You have abnormal vaginal discharge with abdominal pain. °This information is not intended to replace advice given to you by your health care provider. Make sure you discuss any questions you have with your health care provider. °Document Released: 09/05/2005 Document Revised: 06/16/2016 Document Reviewed: 04/04/2013 °Elsevier Interactive Patient Education © 2018 [employer] ° °Constipation, Adult °Constipation is when a person has fewer bowel movements in a week than normal, has difficulty having a bowel movement, or has stools that  are dry, hard, or larger than normal. Constipation may be caused by an underlying condition. It may become worse with age if a person takes certain medicines and does not take in enough fluids. °Follow these instructions at home: °Eating and drinking ° °· Eat foods that have a lot of fiber, such as fresh fruits and vegetables, whole grains, and beans. °· Limit foods that are high in fat, low in fiber, or overly processed, such as french fries, hamburgers, cookies, candies, and soda. °· Drink enough fluid to keep your urine clear or pale yellow. °General instructions °· Exercise regularly or as told by your health care provider. °· Go to the restroom when you have the urge to go. Do not hold it in. °· Take over-the-counter and prescription medicines only as told by your health care provider. These include any fiber supplements. °· Practice pelvic floor retraining exercises, such as deep breathing while relaxing the lower abdomen and pelvic floor relaxation during bowel movements. °· Watch your condition for any changes. °· Keep all follow-up visits as told by your health care provider. This is important. °Contact a health care provider if: °· You have pain that gets worse. °· You have a fever. °· You do not have a bowel movement after 4 days. °· You vomit. °· You are not hungry. °· You lose weight. °· You are bleeding from the anus. °· You have thin, pencil-like stools. °Get help right away if: °· You have a fever and your symptoms suddenly get worse. °· You leak stool or have blood in   your stool. °· Your abdomen is bloated. °· You have severe pain in your abdomen. °· You feel dizzy or you faint. °This information is not intended to replace advice given to you by your health care provider. Make sure you discuss any questions you have with your health care provider. °Document Released: 06/03/2004 Document Revised: 03/25/2016 Document Reviewed: 02/24/2016 °Elsevier Interactive Patient Education © 2018 Elsevier Inc. ° °

## 2017-10-08 NOTE — MAU Provider Note (Signed)
Chief Complaint: Abdominal Pain   First Provider Initiated Contact with Patient 10/07/17 2331     SUBJECTIVE HPI: Allison Hamilton is a 22 y.o. G3P0020 at 5376w1d who presents to Maternity Admissions reporting moderate to severe cramping today.  Location: low abd Quality: cramping, tightening up Severity: 8/10 on pain scale Duration: < 24 hours Context: [redacted] weeks gestation  Timing: intermittent Modifying factors: Some worsening w/ mvmt, but present at rest Associated signs and symptoms: Neg for VB, LOF, urinary complaints. Has had N/V throughout pregnancy w/ no change. Having constipation, but able to have BM today.   Being treated for Yeast infection. On last day of Terazol. Neg GC/Chlamydia cultures neg 09/20/17. Declines repeat today. Has not had anatomy US yet. Scheduled for 10/17/17.  Past Medical History:  Diagnosis Date  . Medical history non-contributory    OB History  Gravida Para Term Preterm AB Living  3       2 0  SAB TAB Ectopic Multiple Live Births  2 0          # Outcome Date GA Lbr Len/2nd Weight Sex Delivery Anes PTL Lv  3 Current           2 SAB 08/2015          1 SAB 04/2015             Past Surgical History:  Procedure Laterality Date  . NO PAST SURGERIES     Social History   Socioeconomic History  . Marital status: Single    Spouse name: Not on file  . Number of children: Not on file  . Years of education: Not on file  . Highest education level: Not on file  Social Needs  . Financial resource strain: Not on file  . Food insecurity - worry: Not on file  . Food insecurity - inability: Not on file  . Transportation needs - medical: Not on file  . Transportation needs - non-medical: Not on file  Occupational History  . Not on file  Tobacco Use  . Smoking status: Former Smoker    Types: Cigarettes    Last attempt to quit: 07/2017    Years since quitting: 0.2  . Smokeless tobacco: Never Used  Substance and Sexual Activity  . Alcohol use: No     Frequency: Never  . Drug use: No  . Sexual activity: Yes    Birth control/protection: None  Other Topics Concern  . Not on file  Social History Narrative  . Not on file   History reviewed. No pertinent family history. No current facility-administered medications on file prior to encounter.    Current Outpatient Medications on File Prior to Encounter  Medication Sig Dispense Refill  . diphenhydramine-acetaminophen (TYLENOL PM) 25-500 MG TABS tablet Take 2 tablets by mouth at bedtime as needed (sleep).    . Doxylamine-Pyridoxine (DICLEGIS) 10-10 MG TBEC Take 1 tablet with breakfast and lunch.  Take 2 tablets at bedtime. 100 tablet 4  . metroNIDAZOLE (METROGEL VAGINAL) 0.75 % vaginal gel Place 1 Applicatorful vaginally 2 (two) times daily. 70 g 0  . Prenat-FeAsp-Meth-FA-DHA w/o A (PRENATE PIXIE) 10-0.6-0.4-200 MG CAPS Take 1 tablet by mouth daily. 30 capsule 12  . Prenatal Vit-Fe Fumarate-FA (MULTIVITAMIN-PRENATAL) 27-0.8 MG TABS tablet Take 1 tablet by mouth daily at 12 noon.    Marland Kitchen. terconazole (TERAZOL 7) 0.4 % vaginal cream Place 1 applicator vaginally at bedtime. Use for 7 days. 45 g 0  . Vitamin D, Ergocalciferol, (DRISDOL) 50000 units  CAPS capsule Take 1 capsule (50,000 Units total) by mouth every 7 (seven) days. 30 capsule 2   No Known Allergies  I have reviewed patient's Past Medical Hx, Surgical Hx, Family Hx, Social Hx, medications and allergies.   Review of Systems  Constitutional: Negative for appetite change, chills and fever.  Gastrointestinal: Positive for abdominal pain, constipation, nausea and vomiting. Negative for abdominal distention, blood in stool and diarrhea.  Genitourinary: Positive for vaginal discharge. Negative for difficulty urinating, dysuria, flank pain, frequency, hematuria, urgency and vaginal bleeding.  Musculoskeletal: Positive for back pain.    OBJECTIVE Patient Vitals for the past 24 hrs:  BP Temp Pulse Resp Height Weight  10/07/17 2244 96/84 97.7  F (36.5 C) 98 18 5\' 3"  (1.6 m) 136 lb (61.7 kg)   Constitutional: Well-developed, well-nourished female in mild-mod distress.  Cardiovascular: normal rate Respiratory: normal rate and effort.  GI: Abd soft, mild bilat low abd TTP. Neg rebound tenderness, gravid appropriate for gestational age.  MS: Extremities nontender, no edema, normal ROM Neurologic: Alert and oriented x 4.  GU: Neg CVAT.  PELVIC EXAM: NEFG, physiologic discharge, no blood noted, cervix closed, positioned low in pelvis; uterus 18-week size, no adnexal tenderness or masses. No CMT.  FHR 150 by doppler.   LAB RESULTS Results for orders placed or performed during the hospital encounter of 10/07/17 (from the past 24 hour(s))  Urinalysis, Routine w reflex microscopic     Status: Abnormal   Collection Time: 10/07/17 10:50 PM  Result Value Ref Range   Color, Urine YELLOW YELLOW   APPearance CLOUDY (A) CLEAR   Specific Gravity, Urine 1.023 1.005 - 1.030   pH 7.0 5.0 - 8.0   Glucose, UA NEGATIVE NEGATIVE mg/dL   Hgb urine dipstick NEGATIVE NEGATIVE   Bilirubin Urine NEGATIVE NEGATIVE   Ketones, ur 20 (A) NEGATIVE mg/dL   Protein, ur 30 (A) NEGATIVE mg/dL   Nitrite NEGATIVE NEGATIVE   Leukocytes, UA NEGATIVE NEGATIVE   RBC / HPF 0-5 0 - 5 RBC/hpf   WBC, UA 0-5 0 - 5 WBC/hpf   Bacteria, UA RARE (A) NONE SEEN   Squamous Epithelial / LPF 0-5 (A) NONE SEEN   Mucus PRESENT     IMAGING No results found.  MAU COURSE Orders Placed This Encounter  Procedures  . [place] MFM OB Limited  . [place] MFM OB Transvaginal  . Urinalysis, Routine w reflex microscopic  . Apply heat to affected area  . Discharge patient   Meds ordered this encounter  Medications  . ibuprofen (ADVIL,MOTRIN) tablet 600 mg  . ondansetron (ZOFRAN) tablet 8 mg  . promethazine (PHENERGAN) 25 MG tablet    Sig: Take 1 tablet (25 mg total) by mouth every 6 (six) hours as needed for nausea or vomiting.    Dispense:  30 tablet    Refill:  3    Order  Specific Question:   Supervising Provider    Answer:   Conan Bowens [[id number]]  . polyethylene glycol powder (GLYCOLAX/MIRALAX) powder    Sig: Take 17 g by mouth daily as needed for moderate constipation.    Dispense:  500 g    Refill:  2    Order Specific Question:   Supervising Provider    Answer:   Conan Bowens [[id number]]   [place] CL 3.36 cm   MDM - Abd pain in pregnancy likely 2/2 constipation. No evidence of preterm labor/short cervix. UA w/out evidence of UTI. Pain improved significantly w/ Ibuprofen and heat.  -  N/V of pregnancy controlled adequately w/ Zofran (given to avoid drowsiness.) Will continue phenergen to avoid worsening constipation.   ASSESSMENT 1. Abdominal pain affecting pregnancy   2. [redacted] weeks gestation of pregnancy   3. Constipation during pregnancy in second trimester   4. Nausea/vomiting in pregnancy     PLAN Discharge home in stable condition. And pain precautions Increase fluids, fiber. Try prune juice.  Follow-up Information    [facility] Follow up on 10/18/2017.   Why:  as scheduled or sooner as needed if symptoms worsen Contact information: [address] [place] [address] [phone]       [facility] AD[facility] Follow up.   Why:  in pregnancy emergencies Contact information: [address] [id number] mc [place] [address] [phone]         Allergies as of 10/08/2017   No Known Allergies     Medication List    STOP taking these medications   multivitamin-prenatal 27-0.8 MG Tabs tablet     TAKE these medications   diphenhydramine-acetaminophen 25-500 MG Tabs tablet Commonly known as:  TYLENOL PM Take 2 tablets by mouth at bedtime as needed (sleep).   Doxylamine-Pyridoxine 10-10 MG Tbec Commonly known as:  DICLEGIS Take 1 tablet with breakfast and lunch.  Take 2 tablets at bedtime.   metroNIDAZOLE 0.75 % vaginal  gel Commonly known as:  METROGEL VAGINAL Place 1 Applicatorful vaginally 2 (two) times daily.   polyethylene glycol powder powder Commonly known as:  GLYCOLAX/MIRALAX Take 17 g by mouth daily as needed for moderate constipation.   PRENATE PIXIE 10-0.6-0.4-200 MG Caps Take 1 tablet by mouth daily.   promethazine 25 MG tablet Commonly known as:  PHENERGAN Take 1 tablet (25 mg total) by mouth every 6 (six) hours as needed for nausea or vomiting.   terconazole 0.4 % vaginal cream Commonly known as:  TERAZOL 7 Place 1 applicator vaginally at bedtime. Use for 7 days.   Vitamin D (Ergocalciferol) 50000 units Caps capsule Commonly known as:  DRISDOL Take 1 capsule (50,000 Units total) by mouth every 7 (seven) days.        Katrinka Blazing, [place], CNM 10/08/2017  2:51 AM

## 2017-10-08 NOTE — MAU Note (Signed)
Pt all of a sudden felt sick to her stomach and vomited about 100 cc of yellow emesis. notified provider.,

## 2017-10-11 ENCOUNTER — Encounter (HOSPITAL_COMMUNITY): Payer: Self-pay | Admitting: Nurse Practitioner

## 2017-10-17 ENCOUNTER — Ambulatory Visit (HOSPITAL_COMMUNITY)
Admission: RE | Admit: 2017-10-17 | Discharge: 2017-10-17 | Disposition: A | Discharge status: Home / Self Care | Payer: Medicaid Other | Source: Ambulatory Visit | Attending: Nurse Practitioner | Admitting: Nurse Practitioner

## 2017-10-17 ENCOUNTER — Other Ambulatory Visit: Payer: Self-pay | Admitting: Nurse Practitioner

## 2017-10-17 ENCOUNTER — Ambulatory Visit (HOSPITAL_COMMUNITY): Payer: Medicaid Other

## 2017-10-17 DIAGNOSIS — Z369 Encounter for antenatal screening, unspecified: Secondary | ICD-10-CM | POA: Diagnosis present

## 2017-10-17 DIAGNOSIS — Z363 Encounter for antenatal screening for malformations: Secondary | ICD-10-CM | POA: Insufficient documentation

## 2017-10-17 DIAGNOSIS — Z8279 Family history of other congenital malformations, deformations and chromosomal abnormalities: Secondary | ICD-10-CM | POA: Diagnosis not present

## 2017-10-17 DIAGNOSIS — Z3A19 19 weeks gestation of pregnancy: Secondary | ICD-10-CM | POA: Diagnosis not present

## 2017-10-17 DIAGNOSIS — Z3480 Encounter for supervision of other normal pregnancy, unspecified trimester: Secondary | ICD-10-CM

## 2017-10-18 ENCOUNTER — Other Ambulatory Visit: Payer: Self-pay

## 2017-10-18 ENCOUNTER — Ambulatory Visit (INDEPENDENT_AMBULATORY_CARE_PROVIDER_SITE_OTHER): Payer: Medicaid Other | Admitting: Certified Nurse Midwife

## 2017-10-18 ENCOUNTER — Encounter: Payer: Self-pay | Admitting: Certified Nurse Midwife

## 2017-10-18 VITALS — BP 117/73 | HR 88 | Wt 132.2 lb

## 2017-10-18 DIAGNOSIS — Z2839 Other underimmunization status: Secondary | ICD-10-CM

## 2017-10-18 DIAGNOSIS — O09899 Supervision of other high risk pregnancies, unspecified trimester: Secondary | ICD-10-CM

## 2017-10-18 DIAGNOSIS — Z283 Underimmunization status: Secondary | ICD-10-CM

## 2017-10-18 DIAGNOSIS — E559 Vitamin D deficiency, unspecified: Secondary | ICD-10-CM

## 2017-10-18 DIAGNOSIS — O219 Vomiting of pregnancy, unspecified: Secondary | ICD-10-CM

## 2017-10-18 DIAGNOSIS — Z3480 Encounter for supervision of other normal pregnancy, unspecified trimester: Secondary | ICD-10-CM

## 2017-10-18 MED ORDER — ONDANSETRON HCL 8 MG PO TABS: 8.0000 mg | ORAL_TABLET | Freq: Three times a day (TID) | ORAL | 2 refills | Status: DC | PRN

## 2017-10-18 MED ORDER — VITAMIN D (ERGOCALCIFEROL) 1.25 MG (50000 UNIT) PO CAPS: 50000.0000 [IU] | ORAL_CAPSULE | ORAL | 2 refills | Status: DC

## 2017-10-18 MED ORDER — DOXYLAMINE-PYRIDOXINE 10-10 MG PO TBEC: DELAYED_RELEASE_TABLET | ORAL | 4 refills | Status: DC

## 2017-10-18 NOTE — Progress Notes (Signed)
   PRENATAL VISIT NOTE  Subjective:  Allison Hamilton is a 22 y.o. G3P0020 at 4166w4d being seen today for ongoing prenatal care.  She is currently monitored for the following issues for this low-risk pregnancy and has Major depressive disorder, recurrent episode, moderate (HCC); Supervision of other normal pregnancy, antepartum; Family history of spina bifida; History of twin pregnancy in prior pregnancy; Vitamin D deficiency; Maternal varicella, non-immune; and Vaginal yeast infection on their problem list.  Patient reports no complaints.  Contractions: Not present. Vag. Bleeding: None.   . Denies leaking of fluid.   The following portions of the patient's history were reviewed and updated as appropriate: allergies, current medications, past family history, past medical history, past social history, past surgical history and problem list. Problem list updated.  Objective:   Vitals:   10/18/17 1341  BP: 117/73  Pulse: 88  Weight: 132 lb 3.2 oz (60 kg)    Fetal Status: Fetal Heart Rate (bpm): 145; doppler Fundal Height: 19 cm       General:  Alert, oriented and cooperative. Patient is in no acute distress.  Skin: Skin is warm and dry. No rash noted.   Cardiovascular: Normal heart rate noted  Respiratory: Normal respiratory effort, no problems with respiration noted  Abdomen: Soft, gravid, appropriate for gestational age.  Pain/Pressure: Present     Pelvic: Cervical exam deferred        Extremities: Normal range of motion.  Edema: None  Mental Status:  Normal mood and affect. Normal behavior. Normal judgment and thought content.   Assessment and Plan:  Pregnancy: G3P0020 at 5266w4d  1. Supervision of other normal pregnancy, antepartum      Has lost 8 lbs.  Reports increased nausea and vomiting.  IM phenergan given in office.  Misty called pharmacy about Dicelgis.  F/U precautions regarding diet given.    - US MFM OB FOLLOW UP; Future  2. Vitamin D deficiency     Taking weekly vitamin  D.  - Vitamin D, Ergocalciferol, (DRISDOL) 50000 units CAPS capsule; Take 1 capsule (50,000 Units total) by mouth every 7 (seven) days.  Dispense: 30 capsule; Refill: 2  3. Maternal varicella, non-immune    Varicella postpartum  4. Nausea/vomiting in pregnancy    - Doxylamine-Pyridoxine (DICLEGIS) 10-10 MG TBEC; Take 1 tablet with breakfast and lunch.  Take 2 tablets at bedtime.  Dispense: 100 tablet; Refill: 4 - ondansetron (ZOFRAN) 8 MG tablet; Take 1 tablet (8 mg total) by mouth every 8 (eight) hours as needed for nausea or vomiting.  Dispense: 40 tablet; Refill: 2  Preterm labor symptoms and general obstetric precautions including but not limited to vaginal bleeding, contractions, leaking of fluid and fetal movement were reviewed in detail with the patient. Please refer to After Visit Summary for other counseling recommendations.  Return in about 4 weeks (around 11/15/2017) for ROB.   Roe Coombsachelle A Abrar Koone, CNM

## 2017-10-26 ENCOUNTER — Ambulatory Visit (INDEPENDENT_AMBULATORY_CARE_PROVIDER_SITE_OTHER): Payer: Medicaid Other | Admitting: Obstetrics & Gynecology

## 2017-10-26 ENCOUNTER — Other Ambulatory Visit (HOSPITAL_COMMUNITY)
Admission: RE | Admit: 2017-10-26 | Discharge: 2017-10-26 | Disposition: A | Discharge status: Home / Self Care | Payer: Medicaid Other | Source: Ambulatory Visit | Attending: Obstetrics & Gynecology | Admitting: Obstetrics & Gynecology

## 2017-10-26 VITALS — Wt 134.5 lb

## 2017-10-26 DIAGNOSIS — Z3480 Encounter for supervision of other normal pregnancy, unspecified trimester: Secondary | ICD-10-CM

## 2017-10-26 DIAGNOSIS — N898 Other specified noninflammatory disorders of vagina: Secondary | ICD-10-CM | POA: Diagnosis present

## 2017-10-26 DIAGNOSIS — O26892 Other specified pregnancy related conditions, second trimester: Secondary | ICD-10-CM

## 2017-10-26 NOTE — Patient Instructions (Signed)
Return to clinic for any scheduled appointments or obstetric concerns, or go to MAU for evaluation  

## 2017-10-26 NOTE — Progress Notes (Signed)
PRENATAL VISIT NOTE  Subjective:  Allison Hamilton is a 22 y.o. G3P0020 at [redacted]w[redacted]d being seen today for ongoing prenatal care.  She is currently monitored for the following issues for this low-risk pregnancy and has Major depressive disorder, recurrent episode, moderate (HCC); Supervision of other normal pregnancy, antepartum; Family history of spina bifida; History of twin pregnancy in prior pregnancy; Vitamin D deficiency; Maternal varicella, non-immune; and Vaginal yeast infection on their problem list.  Patient reports vaginal irritation for a few days with thick, yellow discharge. Was recently treated for yeast infection.  Contractions: Not present. Vag. Bleeding: None.  Movement: Present. Denies leaking of fluid.   The following portions of the patient's history were reviewed and updated as appropriate: allergies, current medications, past family history, past medical history, past social history, past surgical history and problem list. Problem list updated.  Objective:   Vitals:   10/26/17 1615  Weight: 134 lb 8 oz (61 kg)    Fetal Status: Fetal Heart Rate (bpm): 140 Fundal Height: 21 cm Movement: Present     General:  Alert, oriented and cooperative. Patient is in no acute distress.  Skin: Skin is warm and dry. No rash noted.   Cardiovascular: Normal heart rate noted  Respiratory: Normal respiratory effort, no problems with respiration noted  Abdomen: Soft, gravid, appropriate for gestational age.  Pain/Pressure: Absent     Pelvic: Yellow discharge seen. No erythema. Testing sample obtained.  Extremities: Normal range of motion.  Edema: None  Mental Status:  Normal mood and affect. Normal behavior. Normal judgment and thought content.   [place] Mfm Ob Comp + 14 Wk  Result Date: 10/17/2017 ----------------------------------------------------------------------  OBSTETRICS REPORT                      (Signed Final 10/17/2017 02:40 pm)  ---------------------------------------------------------------------- Patient Info  ID #:       [id number]                          D.O.B.:  15-Jun-1996 (21 yrs)  Name:       Allison Hamilton                 Visit Date: 10/17/2017 02:02 pm ---------------------------------------------------------------------- Performed By  Performed By:     Tommi Emery         Ref. Address:     [address]                    RDMS                                                             Rd                                                             [employer]  [phone]  Attending:        Charlsie MerlesMark Newman MD         Secondary Phy.:   MAU Nursing-                                                             MAU/Triage  Referred By:      Dorathy KinsmanVIRGINIA SMITH         [facility]                    [facility] ---------------------------------------------------------------------- Orders   #  Description                                 Code   1  US MFM OB COMP + 14 WK                      76805.01  ----------------------------------------------------------------------   #  Ordered By               Order #        Accession #    Episode #   1  Nolene BernheimERRI BURLESON           [id number]      [id number]     [id number]  ---------------------------------------------------------------------- Indications   [redacted] weeks gestation of pregnancy                Z3A.19   Encounter for antenatal screening for          Z36.3   malformations   Low risk NIPS   Family history of congenital anomaly (spina    Z82.79   bifida)  ---------------------------------------------------------------------- OB History  Gravidity:    2          SAB:   1  Living:       0 ---------------------------------------------------------------------- Fetal Evaluation  Num Of Fetuses:     1  Fetal Heart         144  Rate(bpm):  Cardiac Activity:   Observed  Presentation:       Cephalic  Placenta:           Fundal, above cervical  os  P. Cord Insertion:  Visualized  Amniotic Fluid  AFI FV:      Subjectively within normal limits                              Largest Pocket(cm)                              4.5 ---------------------------------------------------------------------- Biometry  BPD:      45.4  mm     G. Age:  19w 5d         63  %    CI:        84.33   %    70 - 86  FL/HC:      16.7   %    16.1 - 18.3  HC:      155.8  mm     G. Age:  18w 3d          9  %    HC/AC:      1.17        1.09 - 1.39  AC:      132.6  mm     G. Age:  18w 5d         24  %    FL/BPD:     57.3   %  FL:         26  mm     G. Age:  17w 6d          5  %    FL/AC:      19.6   %    20 - 24  HUM:      26.8  mm     G. Age:  18w 4d         27  %  CER:      19.6  mm     G. Age:  18w 6d         36  %  NFT:       3.7  mm  CM:        4.1  mm  Est. FW:     239  gm      0 lb 8 oz     27  % ---------------------------------------------------------------------- Gestational Age  LMP:           19w 3d        Date:  06/03/17                 EDD:   03/10/18  U/S Today:     18w 5d                                        EDD:   03/15/18  Best:          19w 3d     Det. By:  LMP  (06/03/17)          EDD:   03/10/18 ---------------------------------------------------------------------- Anatomy  Cranium:               Appears normal         Aortic Arch:            Appears normal  Cavum:                 Appears normal         Ductal Arch:            Appears normal  Ventricles:            Appears normal         Diaphragm:              Appears normal  Choroid Plexus:        Appears normal         Stomach:                Appears normal, left  sided  Cerebellum:            Appears normal         Abdomen:                Appears normal  Posterior Fossa:       Appears normal         Abdominal Wall:         Appears nml (cord                                                                         insert, abd wall)  Nuchal Fold:           Appears normal         Cord Vessels:           Appears normal (3                                                                        vessel cord)  Face:                  Appears normal         Kidneys:                Appear normal                         (orbits and profile)  Lips:                  Appears normal         Bladder:                Appears normal  Thoracic:              Appears normal         Spine:                  Appears normal  Heart:                 Appears normal         Upper Extremities:      Appears normal                         (4CH, axis, and situs  RVOT:                  Appears normal         Lower Extremities:      Appears normal  LVOT:                  Appears normal  Other:  Female gender. Heels and 5th digit visualized. ---------------------------------------------------------------------- Cervix Uterus Adnexa  Cervix  Length:            3.4  cm.  Normal appearance by transabdominal scan.  Uterus  No abnormality visualized.  Left Ovary  Not visualized.  Right Ovary  Size(cm)        3   x   2.9    x  3.5       Vol(ml): 15.9  Within normal limits.  Cul De Sac:   No free fluid seen.  Adnexa:       No abnormality visualized. ---------------------------------------------------------------------- Impression  Singleton intrauterine pregnancy at 19+3 weeks with family  history of NTD, here for anatomic survey  Review of the anatomy shows no sonographic markers for  aneuploidy or structural anomalies  All relevant fetal anatomy has been visualized  Amniotic fluid volume is normal  Estimated fetal weight shows growth in the 27th percentile.  AC is in the 24th percentile ---------------------------------------------------------------------- Recommendations  Anatomic survey is complete. Recommend follow-up  ultrasound examnination in 4-6 weeks to assess rate of fetal  growth  ----------------------------------------------------------------------                 Charlsie Merles, MD Electronically Signed Final Report   10/17/2017 02:40 pm ----------------------------------------------------------------------  [place] Mfm Ob Limited  Result Date: 10/08/2017 ----------------------------------------------------------------------  OBSTETRICS REPORT                        (Signed Final 10/08/2017 03:36 pm) ---------------------------------------------------------------------- Patient Info  ID #:        [id number]                          D.O.B.:  11/18/1995 (21 yrs)  Name:        [facility]                 Visit Date: 10/08/2017 12:26 am ---------------------------------------------------------------------- Performed By  Performed By:      Raoul Pitch        Ref. Address:      [address]                     [place]                                      Rd                                                               [place]y [place]                                                               [phone]  Attending:         Darlyn Read MD         Secondary Phy.:    MAU Nursing-                                                               MAU/Triage  Referred By:       Dorathy Kinsman CNM     Location:          [facility] ---------------------------------------------------------------------- Orders   #  Description                                 Code   1  [place] MFM OB LIMITED                           [id number]  ----------------------------------------------------------------------   #  Ordered By                Order #        Accession #    Episode #   1  Dorathy Kinsman            [id number]      [id number]     [id number]  ---------------------------------------------------------------------- Indications   [redacted] weeks gestation of pregnancy                 Z3A.18   Abdominal pain in pregnancy                     O99.89  ---------------------------------------------------------------------- OB History   Gravidity:     2         SAB:   1  Living:        0 ---------------------------------------------------------------------- Fetal Evaluation  Num Of Fetuses:      1  Fetal Heart          145  Rate(bpm):  Cardiac Activity:    Observed  Presentation:        Variable  Placenta:            Fundal, above cervical os  Amniotic Fluid  AFI FV:      Subjectively within normal limits                              Largest Pocket(cm)                              4.5 ---------------------------------------------------------------------- Gestational Age  LMP:            18w 1d       Date:  06/03/17                   EDD:  03/10/18  Best:           18w 1d    Det. By:  LMP  (06/03/17)            EDD:  03/10/18 ---------------------------------------------------------------------- Cervix Uterus Adnexa  Cervix  Length:            3.36  cm.  Normal appearance by transabdominal scan.  Uterus  Single fibroid noted, see table below.  Left Ovary  Not visualized.  Right Ovary  Within normal limits.  Cul De Sac:   No free fluid seen.  Adnexa:       No abnormality visualized. ---------------------------------------------------------------------- Myomas   Site                     L(cm)       W(cm)      D(cm)       Location  Anterior                 1.5         1.5        0.9         Subserosal  ----------------------------------------------------------------------   Blood Flow                  RI       PI        Comments  ---------------------------------------------------------------------- Impression  Single living intrauterine pregnancy at 18w 1d.  Variable presentation.  Placenta Fundal, above cervical os.  No sonographic evidence of placenta previa or abruption.  Normal amniotic fluid volume.  Fibroid as above. ---------------------------------------------------------------------- Recommendations  Continue clinical evaluation and management. ----------------------------------------------------------------------                   Darlyn Read, MD Electronically Signed Final Report   10/08/2017 03:36 pm ----------------------------------------------------------------------   Assessment and Plan:  Pregnancy: G3P0020 at [redacted]w[redacted]d  1. Vaginal discharge during pregnancy in second trimester - Cervicovaginal ancillary test done, will follow up results and manage accordingly.  2. Supervision of other normal pregnancy, antepartum Preterm labor symptoms and general obstetric precautions including but not limited to vaginal bleeding, contractions, leaking of fluid and fetal movement were reviewed in detail with the patient. Please refer to After Visit Summary for other counseling recommendations.  Return in about 4 weeks (around 11/23/2017).   Jaynie Collins, MD

## 2017-11-01 ENCOUNTER — Other Ambulatory Visit: Payer: Self-pay | Admitting: Obstetrics & Gynecology

## 2017-11-01 DIAGNOSIS — O98819 Other maternal infectious and parasitic diseases complicating pregnancy, unspecified trimester: Secondary | ICD-10-CM

## 2017-11-01 DIAGNOSIS — B373 Candidiasis of vulva and vagina: Secondary | ICD-10-CM

## 2017-11-01 LAB — CERVICOVAGINAL ANCILLARY ONLY
BACTERIAL VAGINITIS: NEGATIVE
Candida vaginitis: POSITIVE — AB
Chlamydia: NEGATIVE
Neisseria Gonorrhea: NEGATIVE
Trichomonas: NEGATIVE

## 2017-11-01 MED ORDER — TERCONAZOLE 0.4 % VA CREA: 1.0000 | TOPICAL_CREAM | Freq: Every day | VAGINAL | 0 refills | Status: DC

## 2017-11-15 ENCOUNTER — Encounter: Payer: Medicaid Other | Admitting: Certified Nurse Midwife

## 2017-11-16 ENCOUNTER — Encounter: Payer: Self-pay | Admitting: Certified Nurse Midwife

## 2017-11-16 ENCOUNTER — Ambulatory Visit (INDEPENDENT_AMBULATORY_CARE_PROVIDER_SITE_OTHER): Payer: Medicaid Other | Admitting: Certified Nurse Midwife

## 2017-11-16 VITALS — BP 120/81 | HR 114 | Wt 145.6 lb

## 2017-11-16 DIAGNOSIS — Z3482 Encounter for supervision of other normal pregnancy, second trimester: Secondary | ICD-10-CM

## 2017-11-16 DIAGNOSIS — O09899 Supervision of other high risk pregnancies, unspecified trimester: Secondary | ICD-10-CM

## 2017-11-16 DIAGNOSIS — Z2839 Other underimmunization status: Secondary | ICD-10-CM

## 2017-11-16 DIAGNOSIS — E559 Vitamin D deficiency, unspecified: Secondary | ICD-10-CM

## 2017-11-16 DIAGNOSIS — Z283 Underimmunization status: Secondary | ICD-10-CM

## 2017-11-16 DIAGNOSIS — Z3480 Encounter for supervision of other normal pregnancy, unspecified trimester: Secondary | ICD-10-CM

## 2017-11-16 NOTE — Progress Notes (Signed)
Pt denies concerns at this time. 

## 2017-11-16 NOTE — Progress Notes (Signed)
   PRENATAL VISIT NOTE  Subjective:  Allison Hamilton is a 22 y.o. G3P0020 at 7769w5d being seen today for ongoing prenatal care.  She is currently monitored for the following issues for this low-risk pregnancy and has Major depressive disorder, recurrent episode, moderate (HCC); Supervision of other normal pregnancy, antepartum; Family history of spina bifida; History of twin pregnancy in prior pregnancy; Vitamin D deficiency; Maternal varicella, non-immune; and Vaginal yeast infection on their problem list.  Patient reports no complaints.  Contractions: Not present. Vag. Bleeding: None.  Movement: Present. Denies leaking of fluid.   The following portions of the patient's history were reviewed and updated as appropriate: allergies, current medications, past family history, past medical history, past social history, past surgical history and problem list. Problem list updated.  Objective:   Vitals:   11/16/17 1505  BP: 120/81  Pulse: (!) 114  Weight: 145 lb 9.6 oz (66 kg)    Fetal Status: Fetal Heart Rate (bpm): 145; doppler Fundal Height: 24 cm Movement: Present     General:  Alert, oriented and cooperative. Patient is in no acute distress.  Skin: Skin is warm and dry. No rash noted.   Cardiovascular: Normal heart rate noted  Respiratory: Normal respiratory effort, no problems with respiration noted  Abdomen: Soft, gravid, appropriate for gestational age.  Pain/Pressure: Absent     Pelvic: Cervical exam deferred        Extremities: Normal range of motion.  Edema: Trace  Mental Status:  Normal mood and affect. Normal behavior. Normal judgment and thought content.   Assessment and Plan:  Pregnancy: G3P0020 at 8569w5d  1. Supervision of other normal pregnancy, antepartum     Doing well  2. Maternal varicella, non-immune     Varicella postpartum  3. Vitamin D deficiency     Taking weekly vitamin D  Preterm labor symptoms and general obstetric precautions including but not limited  to vaginal bleeding, contractions, leaking of fluid and fetal movement were reviewed in detail with the patient. Please refer to After Visit Summary for other counseling recommendations.  Return in about 4 weeks (around 12/14/2017) for ROB, 2 hr OGTT.   Roe Coombsachelle A Kassem Kibbe, CNM

## 2017-11-16 NOTE — Patient Instructions (Addendum)
Glucose Tolerance Test During Pregnancy The glucose tolerance test (GTT) is a blood test used to determine if you have developed a type of diabetes during pregnancy (gestational diabetes). This is when your body does not properly process sugar (glucose) in the food you eat, resulting in high blood glucose levels. Typically, a GTT is done after you have had a 1-hour glucose test with results that indicate you possibly have gestational diabetes. It may also be done if:  You have a history of giving birth to very large babies or have experienced repeated fetal loss (stillbirth).  You have signs and symptoms of diabetes, such as: ? Changes in your vision. ? Tingling or numbness in your hands or feet. ? Changes in hunger, thirst, and urination not otherwise explained by your pregnancy.  The GTT lasts about 3 hours. You will be given a sugar-water solution to drink at the beginning of the test. You will have blood drawn before you drink the solution and then again 1, 2, and 3 hours after you drink it. You will not be allowed to eat or drink anything else during the test. You must remain at the testing location to make sure that your blood is drawn on time. You should also avoid exercising during the test, because exercise can alter test results. How do I prepare for this test? Eat normally for 3 days prior to the GTT test, including having plenty of carbohydrate-rich foods. Do not eat or drink anything except water during the final 12 hours before the test. In addition, your health care provider may ask you to stop taking certain medicines before the test. What do the results mean? It is your responsibility to obtain your test results. Ask the lab or department performing the test when and how you will get your results. Contact your health care provider to discuss any questions you have about your results. Range of Normal Values Ranges for normal values may vary among different labs and hospitals. You  should always check with your health care provider after having lab work or other tests done to discuss whether your values are considered within normal limits. Normal levels of blood glucose are as follows:  Fasting: less than 105 mg/dL.  1 hour after drinking the solution: less than 190 mg/dL.  2 hours after drinking the solution: less than 165 mg/dL.  3 hours after drinking the solution: less than 145 mg/dL.  Some substances can interfere with GTT results. These may include:  Blood pressure and heart failure medicines, including beta blockers, furosemide, and thiazides.  Anti-inflammatory medicines, including aspirin.  Nicotine.  Some psychiatric medicines.  Meaning of Results Outside Normal Value Ranges GTT test results that are above normal values may indicate a number of health problems, such as:  Gestational diabetes.  Acute stress response.  Cushing syndrome.  Tumors such as pheochromocytoma or glucagonoma.  Long-term kidney problems.  Pancreatitis.  Hyperthyroidism.  Current infection.  Discuss your test results with your health care provider. He or she will use the results to make a diagnosis and determine a treatment plan that is right for you. This information is not intended to replace advice given to you by your health care provider. Make sure you discuss any questions you have with your health care provider. Document Released: 03/06/2012 Document Revised: 02/11/2016 Document Reviewed: 01/10/2014 Elsevier Interactive Patient Education  2018 [employer]  Second Trimester of Pregnancy The second trimester is from week 14 through week 27 (months 4 through 6). The second   trimester is often a time when you feel your best. Your body has adjusted to being pregnant, and you begin to feel better physically. Usually, morning sickness has lessened or quit completely, you may have more energy, and you may have an increase in appetite. The second trimester is also a  time when the fetus is growing rapidly. At the end of the sixth month, the fetus is about 9 inches long and weighs about 1 pounds. You will likely begin to feel the baby move (quickening) between 16 and 20 weeks of pregnancy. Body changes during your second trimester Your body continues to go through many changes during your second trimester. The changes vary from woman to woman.  Your weight will continue to increase. You will notice your lower abdomen bulging out.  You may begin to get stretch marks on your hips, abdomen, and breasts.  You may develop headaches that can be relieved by medicines. The medicines should be approved by your health care provider.  You may urinate more often because the fetus is pressing on your bladder.  You may develop or continue to have heartburn as a result of your pregnancy.  You may develop constipation because certain hormones are causing the muscles that push waste through your intestines to slow down.  You may develop hemorrhoids or swollen, bulging veins (varicose veins).  You may have back pain. This is caused by: ? Weight gain. ? Pregnancy hormones that are relaxing the joints in your pelvis. ? A shift in weight and the muscles that support your balance.  Your breasts will continue to grow and they will continue to become tender.  Your gums may bleed and may be sensitive to brushing and flossing.  Dark spots or blotches (chloasma, mask of pregnancy) may develop on your face. This will likely fade after the baby is born.  A dark line from your belly button to the pubic area (linea nigra) may appear. This will likely fade after the baby is born.  You may have changes in your hair. These can include thickening of your hair, rapid growth, and changes in texture. Some women also have hair loss during or after pregnancy, or hair that feels dry or thin. Your hair will most likely return to normal after your baby is born.  What to expect at prenatal  visits During a routine prenatal visit:  You will be weighed to make sure you and the fetus are growing normally.  Your blood pressure will be taken.  Your abdomen will be measured to track your baby's growth.  The fetal heartbeat will be listened to.  Any test results from the previous visit will be discussed.  Your health care provider may ask you:  How you are feeling.  If you are feeling the baby move.  If you have had any abnormal symptoms, such as leaking fluid, bleeding, severe headaches, or abdominal cramping.  If you are using any tobacco products, including cigarettes, chewing tobacco, and electronic cigarettes.  If you have any questions.  Other tests that may be performed during your second trimester include:  Blood tests that check for: ? Low iron levels (anemia). ? High blood sugar that affects pregnant women (gestational diabetes) between 24 and 28 weeks. ? Rh antibodies. This is to check for a protein on red blood cells (Rh factor).  Urine tests to check for infections, diabetes, or protein in the urine.  An ultrasound to confirm the proper growth and development of the baby.  An   amniocentesis to check for possible genetic problems.  Fetal screens for spina bifida and Down syndrome.  HIV (human immunodeficiency virus) testing. Routine prenatal testing includes screening for HIV, unless you choose not to have this test.  Follow these instructions at home: Medicines  Follow your health care provider's instructions regarding medicine use. Specific medicines may be either safe or unsafe to take during pregnancy.  Take a prenatal vitamin that contains at least 600 micrograms (mcg) of folic acid.  If you develop constipation, try taking a stool softener if your health care provider approves. Eating and drinking  Eat a balanced diet that includes fresh fruits and vegetables, whole grains, good sources of protein such as meat, eggs, or tofu, and low-fat  dairy. Your health care provider will help you determine the amount of weight gain that is right for you.  Avoid raw meat and uncooked cheese. These carry germs that can cause birth defects in the baby.  If you have low calcium intake from food, talk to your health care provider about whether you should take a daily calcium supplement.  Limit foods that are high in fat and processed sugars, such as fried and sweet foods.  To prevent constipation: ? Drink enough fluid to keep your urine clear or pale yellow. ? Eat foods that are high in fiber, such as fresh fruits and vegetables, whole grains, and beans. Activity  Exercise only as directed by your health care provider. Most women can continue their usual exercise routine during pregnancy. Try to exercise for 30 minutes at least 5 days a week. Stop exercising if you experience uterine contractions.  Avoid heavy lifting, wear low heel shoes, and practice good posture.  A sexual relationship may be continued unless your health care provider directs you otherwise. Relieving pain and discomfort  Wear a good support bra to prevent discomfort from breast tenderness.  Take warm sitz baths to soothe any pain or discomfort caused by hemorrhoids. Use hemorrhoid cream if your health care provider approves.  Rest with your legs elevated if you have leg cramps or low back pain.  If you develop varicose veins, wear support hose. Elevate your feet for 15 minutes, 3-4 times a day. Limit salt in your diet. Prenatal Care  Write down your questions. Take them to your prenatal visits.  Keep all your prenatal visits as told by your health care provider. This is important. Safety  Wear your seat belt at all times when driving.  Make a list of emergency phone numbers, including numbers for family, friends, the hospital, and police and fire departments. General instructions  Ask your health care provider for a referral to a local prenatal education  class. Begin classes no later than the beginning of month 6 of your pregnancy.  Ask for help if you have counseling or nutritional needs during pregnancy. Your health care provider can offer advice or refer you to specialists for help with various needs.  Do not use hot tubs, steam rooms, or saunas.  Do not douche or use tampons or scented sanitary pads.  Do not cross your legs for long periods of time.  Avoid cat litter boxes and soil used by cats. These carry germs that can cause birth defects in the baby and possibly loss of the fetus by miscarriage or stillbirth.  Avoid all smoking, herbs, alcohol, and unprescribed drugs. Chemicals in these products can affect the formation and growth of the baby.  Do not use any products that contain nicotine or tobacco,   such as cigarettes and e-cigarettes. If you need help quitting, ask your health care provider.  Visit your dentist if you have not gone yet during your pregnancy. Use a soft toothbrush to brush your teeth and be gentle when you floss. Contact a health care provider if:  You have dizziness.  You have mild pelvic cramps, pelvic pressure, or nagging pain in the abdominal area.  You have persistent nausea, vomiting, or diarrhea.  You have a bad smelling vaginal discharge.  You have pain when you urinate. Get help right away if:  You have a fever.  You are leaking fluid from your vagina.  You have spotting or bleeding from your vagina.  You have severe abdominal cramping or pain.  You have rapid weight gain or weight loss.  You have shortness of breath with chest pain.  You notice sudden or extreme swelling of your face, hands, ankles, feet, or legs.  You have not felt your baby move in over an hour.  You have severe headaches that do not go away when you take medicine.  You have vision changes. Summary  The second trimester is from week 14 through week 27 (months 4 through 6). It is also a time when the fetus is  growing rapidly.  Your body goes through many changes during pregnancy. The changes vary from woman to woman.  Avoid all smoking, herbs, alcohol, and unprescribed drugs. These chemicals affect the formation and growth your baby.  Do not use any tobacco products, such as cigarettes, chewing tobacco, and e-cigarettes. If you need help quitting, ask your health care provider.  Contact your health care provider if you have any questions. Keep all prenatal visits as told by your health care provider. This is important. This information is not intended to replace advice given to you by your health care provider. Make sure you discuss any questions you have with your health care provider. Document Released: 08/30/2001 Document Revised: 10/11/2016 Document Reviewed: 10/11/2016 Elsevier Interactive Patient Education  2018 [employer]  

## 2017-11-30 ENCOUNTER — Ambulatory Visit (HOSPITAL_COMMUNITY)
Admission: RE | Admit: 2017-11-30 | Discharge: 2017-11-30 | Disposition: A | Discharge status: Home / Self Care | Payer: Medicaid Other | Source: Ambulatory Visit | Attending: Certified Nurse Midwife | Admitting: Certified Nurse Midwife

## 2017-11-30 ENCOUNTER — Other Ambulatory Visit: Payer: Self-pay | Admitting: Certified Nurse Midwife

## 2017-11-30 DIAGNOSIS — Z8279 Family history of other congenital malformations, deformations and chromosomal abnormalities: Secondary | ICD-10-CM | POA: Diagnosis not present

## 2017-11-30 DIAGNOSIS — Z3480 Encounter for supervision of other normal pregnancy, unspecified trimester: Secondary | ICD-10-CM

## 2017-11-30 DIAGNOSIS — Z362 Encounter for other antenatal screening follow-up: Secondary | ICD-10-CM | POA: Insufficient documentation

## 2017-11-30 DIAGNOSIS — Z3A25 25 weeks gestation of pregnancy: Secondary | ICD-10-CM

## 2017-11-30 DIAGNOSIS — Z3A28 28 weeks gestation of pregnancy: Secondary | ICD-10-CM | POA: Insufficient documentation

## 2017-12-01 ENCOUNTER — Other Ambulatory Visit (HOSPITAL_COMMUNITY): Payer: Self-pay | Admitting: *Deleted

## 2017-12-01 DIAGNOSIS — O365930 Maternal care for other known or suspected poor fetal growth, third trimester, not applicable or unspecified: Secondary | ICD-10-CM

## 2017-12-04 ENCOUNTER — Other Ambulatory Visit: Payer: Self-pay | Admitting: Certified Nurse Midwife

## 2017-12-05 ENCOUNTER — Observation Stay (HOSPITAL_COMMUNITY)
Admission: AD | Admit: 2017-12-05 | Discharge: 2017-12-06 | Disposition: A | Discharge status: Home / Self Care | Payer: Medicaid Other | Source: Ambulatory Visit | Attending: Family Medicine | Admitting: Family Medicine

## 2017-12-05 ENCOUNTER — Inpatient Hospital Stay (HOSPITAL_COMMUNITY): Payer: Medicaid Other

## 2017-12-05 ENCOUNTER — Encounter (HOSPITAL_COMMUNITY): Payer: Self-pay

## 2017-12-05 DIAGNOSIS — Y929 Unspecified place or not applicable: Secondary | ICD-10-CM | POA: Diagnosis not present

## 2017-12-05 DIAGNOSIS — Y9301 Activity, walking, marching and hiking: Secondary | ICD-10-CM | POA: Diagnosis not present

## 2017-12-05 DIAGNOSIS — Y999 Unspecified external cause status: Secondary | ICD-10-CM | POA: Diagnosis not present

## 2017-12-05 DIAGNOSIS — Z3480 Encounter for supervision of other normal pregnancy, unspecified trimester: Secondary | ICD-10-CM

## 2017-12-05 DIAGNOSIS — Z79899 Other long term (current) drug therapy: Secondary | ICD-10-CM | POA: Insufficient documentation

## 2017-12-05 DIAGNOSIS — Z3A26 26 weeks gestation of pregnancy: Secondary | ICD-10-CM | POA: Diagnosis not present

## 2017-12-05 DIAGNOSIS — O368120 Decreased fetal movements, second trimester, not applicable or unspecified: Secondary | ICD-10-CM | POA: Insufficient documentation

## 2017-12-05 DIAGNOSIS — O9A212 Injury, poisoning and certain other consequences of external causes complicating pregnancy, second trimester: Secondary | ICD-10-CM | POA: Diagnosis not present

## 2017-12-05 DIAGNOSIS — W010XXA Fall on same level from slipping, tripping and stumbling without subsequent striking against object, initial encounter: Secondary | ICD-10-CM | POA: Insufficient documentation

## 2017-12-05 DIAGNOSIS — S3991XA Unspecified injury of abdomen, initial encounter: Secondary | ICD-10-CM | POA: Insufficient documentation

## 2017-12-05 DIAGNOSIS — Z87891 Personal history of nicotine dependence: Secondary | ICD-10-CM | POA: Insufficient documentation

## 2017-12-05 LAB — CBC
HCT: 30.5 % — ABNORMAL LOW (ref 36.0–46.0)
Hemoglobin: 10.4 g/dL — ABNORMAL LOW (ref 12.0–15.0)
MCH: 30.3 pg (ref 26.0–34.0)
MCHC: 34.1 g/dL (ref 30.0–36.0)
MCV: 88.9 fL (ref 78.0–100.0)
Platelets: 158 10*3/uL (ref 150–400)
RBC: 3.43 MIL/uL — ABNORMAL LOW (ref 3.87–5.11)
RDW: 13.4 % (ref 11.5–15.5)
WBC: 11.7 10*3/uL — ABNORMAL HIGH (ref 4.0–10.5)

## 2017-12-05 MED ORDER — LACTATED RINGERS IV SOLN
INTRAVENOUS | Status: DC
  Administered 2017-12-05: via INTRAVENOUS
  Administered 2017-12-06: 125 mL/h via INTRAVENOUS
  Administered 2017-12-06: 04:00:00 via INTRAVENOUS

## 2017-12-05 NOTE — MAU Note (Signed)
Pt states she was cleaning the house when she tripped and fell down. Pt states she landed on her left side. Pt reports decreased in fetal movement since the fall and a lot of lower abdominal pain and pressure. Pt denies LOF or vaginal bleeding.

## 2017-12-06 ENCOUNTER — Other Ambulatory Visit: Payer: Self-pay

## 2017-12-06 ENCOUNTER — Encounter (HOSPITAL_COMMUNITY): Payer: Self-pay | Admitting: *Deleted

## 2017-12-06 DIAGNOSIS — W010XXA Fall on same level from slipping, tripping and stumbling without subsequent striking against object, initial encounter: Secondary | ICD-10-CM | POA: Diagnosis not present

## 2017-12-06 DIAGNOSIS — S3991XA Unspecified injury of abdomen, initial encounter: Secondary | ICD-10-CM

## 2017-12-06 DIAGNOSIS — Z3A27 27 weeks gestation of pregnancy: Secondary | ICD-10-CM | POA: Diagnosis not present

## 2017-12-06 DIAGNOSIS — O9A212 Injury, poisoning and certain other consequences of external causes complicating pregnancy, second trimester: Secondary | ICD-10-CM | POA: Diagnosis not present

## 2017-12-06 LAB — COMPREHENSIVE METABOLIC PANEL
ALT: 10 U/L — ABNORMAL LOW (ref 14–54)
AST: 19 U/L (ref 15–41)
Albumin: 3.3 g/dL — ABNORMAL LOW (ref 3.5–5.0)
Alkaline Phosphatase: 71 U/L (ref 38–126)
Anion gap: 11 (ref 5–15)
BUN: 8 mg/dL (ref 6–20)
CO2: 19 mmol/L — ABNORMAL LOW (ref 22–32)
Calcium: 8.8 mg/dL — ABNORMAL LOW (ref 8.9–10.3)
Chloride: 104 mmol/L (ref 101–111)
Creatinine, Ser: 0.39 mg/dL — ABNORMAL LOW (ref 0.44–1.00)
GFR calc Af Amer: >60 mL/min (ref >60)
GFR calc non Af Amer: >60 mL/min (ref >60)
Glucose, Bld: 83 mg/dL (ref 65–99)
Potassium: 3.6 mmol/L (ref 3.5–5.1)
Sodium: 134 mmol/L — ABNORMAL LOW (ref 135–145)
Total Bilirubin: 0.5 mg/dL (ref 0.3–1.2)
Total Protein: 7.2 g/dL (ref 6.5–8.1)

## 2017-12-06 LAB — TYPE AND SCREEN
ABO/RH(D): A POS
Antibody Screen: NEGATIVE

## 2017-12-06 MED ORDER — POLYETHYLENE GLYCOL 3350 17 GM/SCOOP PO POWD
17.0000 g | Freq: Every day | ORAL | Status: DC | PRN
  Filled 2017-12-06: qty 255

## 2017-12-06 MED ORDER — ONDANSETRON HCL 4 MG PO TABS: 8.0000 mg | ORAL_TABLET | Freq: Three times a day (TID) | ORAL | Status: DC | PRN

## 2017-12-06 MED ORDER — DOXYLAMINE-PYRIDOXINE 10-10 MG PO TBEC: 1.0000 | DELAYED_RELEASE_TABLET | Freq: Three times a day (TID) | ORAL | Status: DC

## 2017-12-06 MED ORDER — ACETAMINOPHEN 325 MG PO TABS
650.0000 mg | ORAL_TABLET | ORAL | Status: DC | PRN
Start: 2017-12-06 — End: 2017-12-06

## 2017-12-06 MED ORDER — CALCIUM CARBONATE ANTACID 500 MG PO CHEW: 2.0000 | CHEWABLE_TABLET | ORAL | Status: DC | PRN

## 2017-12-06 MED ORDER — PRENATAL MULTIVITAMIN CH: 1.0000 | ORAL_TABLET | Freq: Every day | ORAL | Status: DC

## 2017-12-06 MED ORDER — ACETAMINOPHEN 500 MG PO TABS
1000.0000 mg | ORAL_TABLET | Freq: Once | ORAL | Status: AC
  Administered 2017-12-06: 1000 mg via ORAL
  Filled 2017-12-06: qty 2

## 2017-12-06 MED ORDER — PRENATAL MULTIVITAMIN CH
1.0000 | ORAL_TABLET | Freq: Every day | ORAL | Status: DC
  Administered 2017-12-06: 1 via ORAL
  Filled 2017-12-06: qty 1

## 2017-12-06 MED ORDER — ZOLPIDEM TARTRATE 5 MG PO TABS: 5.0000 mg | ORAL_TABLET | Freq: Every evening | ORAL | Status: DC | PRN

## 2017-12-06 MED ORDER — DOCUSATE SODIUM 100 MG PO CAPS
100.0000 mg | ORAL_CAPSULE | Freq: Every day | ORAL | Status: DC
  Administered 2017-12-06: 100 mg via ORAL
  Filled 2017-12-06: qty 1

## 2017-12-06 NOTE — MAU Provider Note (Signed)
Chief Complaint:  Fall   First Provider Initiated Contact with Patient 12/05/17 2349      HPI: Allison Hamilton is a 22 y.o. G3P0020 at 38w4dwho presents to maternity admissions reporting fall. She reports she was cleaning her house early this afternoon around 2pm when she tripped and fell. She reports falling down on her left side and hitting the left side of her abdomen. She reported decreased fetal movement an hour after the fall. Upon arrival to MAU around 11pm she reports improved fetal movement. She reports abdominal cramping and pressure, she rates cramping 7/10. She denies LOF, vaginal bleeding, vaginal itching/burning, urinary symptoms, h/a, dizziness, n/v, or fever/chills.    Past Medical History: Past Medical History:  Diagnosis Date  . Medical history non-contributory     Past obstetric history: OB History  Gravida Para Term Preterm AB Living  3       2 0  SAB TAB Ectopic Multiple Live Births  2 0          # Outcome Date GA Lbr Len/2nd Weight Sex Delivery Anes PTL Lv  3 Current           2 SAB 08/2015          1 SAB 04/2015              Past Surgical History: Past Surgical History:  Procedure Laterality Date  . NO PAST SURGERIES      Family History: History reviewed. No pertinent family history.  Social History: Social History   Tobacco Use  . Smoking status: Former Smoker    Types: Cigarettes    Last attempt to quit: 07/2017    Years since quitting: 0.3  . Smokeless tobacco: Never Used  Substance Use Topics  . Alcohol use: No    Frequency: Never  . Drug use: No    Allergies: No Known Allergies  Meds:  Medications Prior to Admission  Medication Sig Dispense Refill Last Dose  . diphenhydramine-acetaminophen (TYLENOL PM) 25-500 MG TABS tablet Take 2 tablets by mouth at bedtime as needed (sleep).   Past Week at Unknown time  . Doxylamine-Pyridoxine (DICLEGIS) 10-10 MG TBEC Take 1 tablet with breakfast and lunch.  Take 2 tablets at bedtime. 100 tablet  4 12/05/2017 at Unknown time  . ondansetron (ZOFRAN) 8 MG tablet Take 1 tablet (8 mg total) by mouth every 8 (eight) hours as needed for nausea or vomiting. 40 tablet 2 Past Month at Unknown time  . polyethylene glycol powder (GLYCOLAX/MIRALAX) powder Take 17 g by mouth daily as needed for moderate constipation. 500 g 2 Past Month at Unknown time  . Prenat-FeAsp-Meth-FA-DHA w/o A (PRENATE PIXIE) 10-0.6-0.4-200 MG CAPS Take 1 tablet by mouth daily. 30 capsule 12 12/05/2017 at Unknown time  . promethazine (PHENERGAN) 25 MG tablet Take 1 tablet (25 mg total) by mouth every 6 (six) hours as needed for nausea or vomiting. 30 tablet 3 Past Month at Unknown time  . Vitamin D, Ergocalciferol, (DRISDOL) 50000 units CAPS capsule Take 1 capsule (50,000 Units total) by mouth every 7 (seven) days. 30 capsule 2 Past Week at Unknown time    ROS:  Review of Systems  Constitutional: Negative.   Respiratory: Negative.   Cardiovascular: Negative.   Gastrointestinal: Positive for abdominal pain. Negative for constipation, diarrhea, nausea and vomiting.       Decreased fetal movement  Genitourinary: Negative.   Musculoskeletal: Negative.   Neurological: Negative.   Psychiatric/Behavioral: Negative.    I have reviewed patient's  Past Medical Hx, Surgical Hx, Family Hx, Social Hx, medications and allergies.   Physical Exam   Patient Vitals for the past 24 hrs:  BP Temp Temp src Pulse Resp SpO2 Height Weight  12/05/17 2313 (!) 109/56 98.3 F (36.8 C) Oral 85 16 100 % 5\' 3"  (1.6 m) 147 lb (66.7 kg)   Constitutional: Well-developed, well-nourished female in no acute distress.  Cardiovascular: normal rate Respiratory: normal effort GI: Abd soft, tender on left side- where patient fell, gravid appropriate for gestational age.  MS: Extremities nontender, no edema, normal ROM Neurologic: Alert and oriented x 4.  GU: Neg CVAT.  CERVICAL EXAM:   Dilation: Closed Effacement (%): Thick Exam by:: Steward DroneVeronica Mele Sylvester,  CNM  FHT:  Baseline 130 , moderate variability, accelerations present (10x10), no decelerations Contractions: q 2-4 mins/ mild by palpation    Labs: Results for orders placed or performed during the hospital encounter of 12/05/17 (from the past 24 hour(s))  CBC     Status: Abnormal   Collection Time: 12/05/17 11:43 PM  Result Value Ref Range   WBC 11.7 (H) 4.0 - 10.5 K/uL   RBC 3.43 (L) 3.87 - 5.11 MIL/uL   Hemoglobin 10.4 (L) 12.0 - 15.0 g/dL   HCT 16.130.5 (L) 09.636.0 - 04.546.0 %   MCV 88.9 78.0 - 100.0 fL   MCH 30.3 26.0 - 34.0 pg   MCHC 34.1 30.0 - 36.0 g/dL   RDW 40.913.4 81.111.5 - 91.415.5 %   Platelets 158 150 - 400 K/uL  Comprehensive metabolic panel     Status: Abnormal   Collection Time: 12/05/17 11:43 PM  Result Value Ref Range   Sodium 134 (L) 135 - 145 mmol/L   Potassium 3.6 3.5 - 5.1 mmol/L   Chloride 104 101 - 111 mmol/L   CO2 19 (L) 22 - 32 mmol/L   Glucose, Bld 83 65 - 99 mg/dL   BUN 8 6 - 20 mg/dL   Creatinine, Ser 7.820.39 (L) 0.44 - 1.00 mg/dL   Calcium 8.8 (L) 8.9 - 10.3 mg/dL   Total Protein 7.2 6.5 - 8.1 g/dL   Albumin 3.3 (L) 3.5 - 5.0 g/dL   AST 19 15 - 41 U/L   ALT 10 (L) 14 - 54 U/L   Alkaline Phosphatase 71 38 - 126 U/L   Total Bilirubin 0.5 0.3 - 1.2 mg/dL   GFR calc non Af Amer >60 >60 mL/min   GFR calc Af Amer >60 >60 mL/min   Anion gap 11 5 - 15  Type and screen [facility]     Status: None   Collection Time: 12/05/17 11:43 PM  Result Value Ref Range   ABO/RH(D) A POS    Antibody Screen NEG    Sample Expiration      12/08/2017 Performed at [facility], [address] [place], [place] [id number]    --/--/A POS (03/19 2343)  Imaging:  FHR: 142 Presentation: Cephalic  Placenta: Posterior, above cervical os  AFV: WNL CL: 3.5cm  No signs of placental abruption or previa identified on US on 12/05/17.  MAU Course/MDM: Orders Placed This Encounter  Procedures  . US MFM OB LIMITED  . CBC  . Comprehensive metabolic panel  . Type and  [facility] [place]  . Insert peripheral IV    Meds ordered this encounter  Medications  . lactated ringers infusion  . acetaminophen (TYLENOL) tablet 1,000 mg   NST reviewed- reactive for gestational age  Treatments in MAU  included LR IV bolus, LR 125mL continuous and 1,000mg  Tylenol for pain and 4 hours of monitoring. Pt reports decreased painful contractions with MAU treatment, reports she still feels contractions.   Consult Dr Adrian BlackwaterStinson with presentation, exam findings and test results. Recommends observation on ante for continued uterine contractions after abdominal trauma.   Assessment: 1. Blunt trauma to abdomen, initial encounter   2. Abdominal trauma, initial encounter   3. Supervision of other normal pregnancy, antepartum     Plan: Admit to Ante for observation  Care transferred to Dr Adrian BlackwaterStinson, orders placed    Steward DroneVeronica Dorance Spink Certified Nurse-Midwife 12/06/2017 2:59 AM

## 2017-12-06 NOTE — Progress Notes (Signed)
Discharge instructions given, questions answered, pt states understanding, signs and given copy 

## 2017-12-06 NOTE — H&P (Signed)
Chief Complaint:  Fall   First Provider Initiated Contact with Patient 12/05/17 2349      HPI: Allison Hamilton is a 22 y.o. G3P0020 at 38w4dwho presents to maternity admissions reporting fall. She reports she was cleaning her house early this afternoon around 2pm when she tripped and fell. She reports falling down on her left side and hitting the left side of her abdomen. She reported decreased fetal movement an hour after the fall. Upon arrival to MAU around 11pm she reports improved fetal movement. She reports abdominal cramping and pressure, she rates cramping 7/10. She denies LOF, vaginal bleeding, vaginal itching/burning, urinary symptoms, h/a, dizziness, n/v, or fever/chills.    Past Medical History: Past Medical History:  Diagnosis Date  . Medical history non-contributory     Past obstetric history: OB History  Gravida Para Term Preterm AB Living  3       2 0  SAB TAB Ectopic Multiple Live Births  2 0          # Outcome Date GA Lbr Len/2nd Weight Sex Delivery Anes PTL Lv  3 Current           2 SAB 08/2015          1 SAB 04/2015              Past Surgical History: Past Surgical History:  Procedure Laterality Date  . NO PAST SURGERIES      Family History: History reviewed. No pertinent family history.  Social History: Social History   Tobacco Use  . Smoking status: Former Smoker    Types: Cigarettes    Last attempt to quit: 07/2017    Years since quitting: 0.3  . Smokeless tobacco: Never Used  Substance Use Topics  . Alcohol use: No    Frequency: Never  . Drug use: No    Allergies: No Known Allergies  Meds:  Medications Prior to Admission  Medication Sig Dispense Refill Last Dose  . diphenhydramine-acetaminophen (TYLENOL PM) 25-500 MG TABS tablet Take 2 tablets by mouth at bedtime as needed (sleep).   Past Week at Unknown time  . Doxylamine-Pyridoxine (DICLEGIS) 10-10 MG TBEC Take 1 tablet with breakfast and lunch.  Take 2 tablets at bedtime. 100 tablet  4 12/05/2017 at Unknown time  . ondansetron (ZOFRAN) 8 MG tablet Take 1 tablet (8 mg total) by mouth every 8 (eight) hours as needed for nausea or vomiting. 40 tablet 2 Past Month at Unknown time  . polyethylene glycol powder (GLYCOLAX/MIRALAX) powder Take 17 g by mouth daily as needed for moderate constipation. 500 g 2 Past Month at Unknown time  . Prenat-FeAsp-Meth-FA-DHA w/o A (PRENATE PIXIE) 10-0.6-0.4-200 MG CAPS Take 1 tablet by mouth daily. 30 capsule 12 12/05/2017 at Unknown time  . promethazine (PHENERGAN) 25 MG tablet Take 1 tablet (25 mg total) by mouth every 6 (six) hours as needed for nausea or vomiting. 30 tablet 3 Past Month at Unknown time  . Vitamin D, Ergocalciferol, (DRISDOL) 50000 units CAPS capsule Take 1 capsule (50,000 Units total) by mouth every 7 (seven) days. 30 capsule 2 Past Week at Unknown time    ROS:  Review of Systems  Constitutional: Negative.   Respiratory: Negative.   Cardiovascular: Negative.   Gastrointestinal: Positive for abdominal pain. Negative for constipation, diarrhea, nausea and vomiting.       Decreased fetal movement  Genitourinary: Negative.   Musculoskeletal: Negative.   Neurological: Negative.   Psychiatric/Behavioral: Negative.    I have reviewed patient's  Past Medical Hx, Surgical Hx, Family Hx, Social Hx, medications and allergies.   Physical Exam   Patient Vitals for the past 24 hrs:  BP Temp Temp src Pulse Resp SpO2 Height Weight  12/05/17 2313 (!) 109/56 98.3 F (36.8 C) Oral 85 16 100 % 5\' 3"  (1.6 m) 147 lb (66.7 kg)   Constitutional: Well-developed, well-nourished female in no acute distress.  Cardiovascular: normal rate Respiratory: normal effort GI: Abd soft, tender on left side- where patient fell, gravid appropriate for gestational age.  MS: Extremities nontender, no edema, normal ROM Neurologic: Alert and oriented x 4.  GU: Neg CVAT.  CERVICAL EXAM:   Dilation: Closed Effacement (%): Thick Exam by:: Steward DroneVeronica Rogers,  CNM  FHT:  Baseline 130 , moderate variability, accelerations present (10x10), no decelerations Contractions: q 2-4 mins/ mild by palpation    Labs: Results for orders placed or performed during the hospital encounter of 12/05/17 (from the past 24 hour(s))  CBC     Status: Abnormal   Collection Time: 12/05/17 11:43 PM  Result Value Ref Range   WBC 11.7 (H) 4.0 - 10.5 K/uL   RBC 3.43 (L) 3.87 - 5.11 MIL/uL   Hemoglobin 10.4 (L) 12.0 - 15.0 g/dL   HCT 16.130.5 (L) 09.636.0 - 04.546.0 %   MCV 88.9 78.0 - 100.0 fL   MCH 30.3 26.0 - 34.0 pg   MCHC 34.1 30.0 - 36.0 g/dL   RDW 40.913.4 81.111.5 - 91.415.5 %   Platelets 158 150 - 400 K/uL  Comprehensive metabolic panel     Status: Abnormal   Collection Time: 12/05/17 11:43 PM  Result Value Ref Range   Sodium 134 (L) 135 - 145 mmol/L   Potassium 3.6 3.5 - 5.1 mmol/L   Chloride 104 101 - 111 mmol/L   CO2 19 (L) 22 - 32 mmol/L   Glucose, Bld 83 65 - 99 mg/dL   BUN 8 6 - 20 mg/dL   Creatinine, Ser 7.820.39 (L) 0.44 - 1.00 mg/dL   Calcium 8.8 (L) 8.9 - 10.3 mg/dL   Total Protein 7.2 6.5 - 8.1 g/dL   Albumin 3.3 (L) 3.5 - 5.0 g/dL   AST 19 15 - 41 U/L   ALT 10 (L) 14 - 54 U/L   Alkaline Phosphatase 71 38 - 126 U/L   Total Bilirubin 0.5 0.3 - 1.2 mg/dL   GFR calc non Af Amer >60 >60 mL/min   GFR calc Af Amer >60 >60 mL/min   Anion gap 11 5 - 15  Type and screen [facility]     Status: None   Collection Time: 12/05/17 11:43 PM  Result Value Ref Range   ABO/RH(D) A POS    Antibody Screen NEG    Sample Expiration      12/08/2017 Performed at [facility], [address] [place], [place] [address]    --/--/A POS (03/19 2343)  Imaging:  FHR: 142 Presentation: Cephalic  Placenta: Posterior, above cervical os  AFV: WNL CL: 3.5cm  No signs of placental abruption or previa identified on US on 12/05/17.  MAU Course/MDM: Orders Placed This Encounter  Procedures  . US MFM OB LIMITED  . CBC  . Comprehensive metabolic panel  . Type and  [facility] [place]  . Insert peripheral IV    Meds ordered this encounter  Medications  . lactated ringers infusion  . acetaminophen (TYLENOL) tablet 1,000 mg   NST reviewed- reactive for gestational age  Treatments in MAU  included LR IV bolus, LR continuous and 1,000mg  Tylenol for pain and 4 hours of monitoring. Pt reports decreased painful contractions with MAU treatment, reports she still feels contractions.   Consult Dr Adrian Blackwater with presentation, exam findings and test results. Recommends observation on ante for continued uterine contractions after abdominal trauma.   Assessment: 1. Blunt trauma to abdomen, initial encounter   2. Abdominal trauma, initial encounter   3. Supervision of other normal pregnancy, antepartum     Plan: Admit to Ante for observation  Care transferred to Dr Adrian Blackwater, orders placed    Steward Drone Certified Nurse-Midwife 12/06/2017 2:59 AM  Attestation of Attending Supervision of Advanced Practitioner (PA/CNM/NP): Evaluation and management procedures were performed by the Advanced Practitioner under my supervision and collaboration.  I have reviewed the Advanced Practitioner's note and chart, and I agree with the management and plan.  24hr Obs Continuous monitoring No evidence of abruption at this moment.  Candelaria Celeste, DO Attending Physician Faculty Practice, [facility] [place]

## 2017-12-06 NOTE — Plan of Care (Signed)
Education initiated

## 2017-12-06 NOTE — Discharge Summary (Signed)
OB Discharge Summary     Patient Name: Allison NasutiShaniya Hamilton DOB: 04/25/1996 MRN: [id number]  Date of admission: 12/05/2017  Date of discharge: 12/06/2017  Admitting diagnosis: 26WKS FALL, DECREASED FETAL MVMNT, PRESSURE Intrauterine pregnancy: 5570w4d     Secondary diagnosis:  Active Problems:   Blunt abdominal trauma  Hospital course:  22 yo G3P0020 at 2170w4d admitted s/p fall with for 24 hour observation. Patient remained stable throughout her hospitalization and denied any cramping pain. She reported good fetal movement and denies vaginal bleeding or leakage of fluid. Fetal status remained reassuring on fetal monitoring. Patient found stable for discharge with plans to follow up as scheduled for routine prenatal care on 3/28  Physical exam  Vitals:   12/05/17 2313 12/06/17 0358 12/06/17 0800 12/06/17 1218  BP: (!) 109/56 120/61 110/68 112/66  Pulse: 85 75 91 98  Resp: 16 17 18 18   Temp: 98.3 F (36.8 C) 97.9 F (36.6 C) 98.4 F (36.9 C) 98.9 F (37.2 C)  TempSrc: Oral Oral Oral Tympanic  SpO2: 100% 100% 100% 100%  Weight: 147 lb (66.7 kg)     Height: 5\' 3"  (1.6 m)      General: alert, cooperative and no distress Uterine Fundus: gravid, non tender DVT Evaluation: No evidence of DVT seen on physical exam.  FHT: baseline 135, mod variability, +accels (10 x 10) no decels Toco: no contractions  Labs: Lab Results  Component Value Date   WBC 11.7 (H) 12/05/2017   HGB 10.4 (L) 12/05/2017   HCT 30.5 (L) 12/05/2017   MCV 88.9 12/05/2017   PLT 158 12/05/2017   CMP Latest Ref Rng & Units 12/05/2017  Glucose 65 - 99 mg/dL 83  BUN 6 - 20 mg/dL 8  Creatinine 4.090.44 - 8.111.00 mg/dL 9.14(N0.39(L)  Sodium 829135 - 562145 mmol/L 134(L)  Potassium 3.5 - 5.1 mmol/L 3.6  Chloride 101 - 111 mmol/L 104  CO2 22 - 32 mmol/L 19(L)  Calcium 8.9 - 10.3 mg/dL 1.3(Y8.8(L)  Total Protein 6.5 - 8.1 g/dL 7.2  Total Bilirubin 0.3 - 1.2 mg/dL 0.5  Alkaline Phos 38 - 126 U/L 71  AST 15 - 41 U/L 19  ALT 14 - 54 U/L  10(L)    Discharge instruction: per After Visit Summary and "Baby and Me Booklet".  After visit meds:  Allergies as of 12/06/2017   No Known Allergies     Medication List    TAKE these medications   diphenhydramine-acetaminophen 25-500 MG Tabs tablet Commonly known as:  TYLENOL PM Take 2 tablets by mouth at bedtime as needed (sleep).   Doxylamine-Pyridoxine 10-10 MG Tbec Commonly known as:  DICLEGIS Take 1 tablet with breakfast and lunch.  Take 2 tablets at bedtime.   ondansetron 8 MG tablet Commonly known as:  ZOFRAN Take 1 tablet (8 mg total) by mouth every 8 (eight) hours as needed for nausea or vomiting.   polyethylene glycol powder powder Commonly known as:  GLYCOLAX/MIRALAX Take 17 g by mouth daily as needed for moderate constipation.   PRENATE PIXIE 10-0.6-0.4-200 MG Caps Take 1 tablet by mouth daily.   promethazine 25 MG tablet Commonly known as:  PHENERGAN Take 1 tablet (25 mg total) by mouth every 6 (six) hours as needed for nausea or vomiting.   Vitamin D (Ergocalciferol) 50000 units Caps capsule Commonly known as:  DRISDOL Take 1 capsule (50,000 Units total) by mouth every 7 (seven) days.       Diet: routine diet  Activity: Advance as tolerated. Pelvic  rest for 6 weeks.   Outpatient follow up: as scheduled Follow up Appt: Future Appointments  Date Time Provider Department Center  12/14/2017  8:15 AM [facility] None  12/14/2017  8:30 AM [place] A, CNM [facility] None  12/21/2017  1:30 PM [facility] [place] 1 [facility]     12/06/2017 Catalina Antigua, MD

## 2017-12-14 ENCOUNTER — Other Ambulatory Visit: Payer: Self-pay

## 2017-12-14 ENCOUNTER — Ambulatory Visit (INDEPENDENT_AMBULATORY_CARE_PROVIDER_SITE_OTHER): Payer: Medicaid Other | Admitting: Certified Nurse Midwife

## 2017-12-14 ENCOUNTER — Other Ambulatory Visit: Payer: Medicaid Other

## 2017-12-14 ENCOUNTER — Inpatient Hospital Stay (HOSPITAL_COMMUNITY)
Admission: AD | Admit: 2017-12-14 | Discharge: 2017-12-14 | Disposition: A | Discharge status: Home / Self Care | Payer: Medicaid Other | Source: Ambulatory Visit | Attending: Obstetrics & Gynecology | Admitting: Obstetrics & Gynecology

## 2017-12-14 ENCOUNTER — Encounter: Payer: Self-pay | Admitting: Certified Nurse Midwife

## 2017-12-14 VITALS — BP 117/74 | HR 121 | Wt 144.4 lb

## 2017-12-14 DIAGNOSIS — O26892 Other specified pregnancy related conditions, second trimester: Secondary | ICD-10-CM | POA: Insufficient documentation

## 2017-12-14 DIAGNOSIS — O99212 Obesity complicating pregnancy, second trimester: Secondary | ICD-10-CM | POA: Insufficient documentation

## 2017-12-14 DIAGNOSIS — Z87891 Personal history of nicotine dependence: Secondary | ICD-10-CM | POA: Diagnosis not present

## 2017-12-14 DIAGNOSIS — T1490XA Injury, unspecified, initial encounter: Secondary | ICD-10-CM | POA: Insufficient documentation

## 2017-12-14 DIAGNOSIS — Z3A27 27 weeks gestation of pregnancy: Secondary | ICD-10-CM | POA: Insufficient documentation

## 2017-12-14 DIAGNOSIS — K5900 Constipation, unspecified: Secondary | ICD-10-CM | POA: Diagnosis not present

## 2017-12-14 DIAGNOSIS — R102 Pelvic and perineal pain: Secondary | ICD-10-CM

## 2017-12-14 DIAGNOSIS — O26899 Other specified pregnancy related conditions, unspecified trimester: Secondary | ICD-10-CM

## 2017-12-14 DIAGNOSIS — Z3689 Encounter for other specified antenatal screening: Secondary | ICD-10-CM

## 2017-12-14 DIAGNOSIS — Z79899 Other long term (current) drug therapy: Secondary | ICD-10-CM | POA: Insufficient documentation

## 2017-12-14 DIAGNOSIS — Z283 Underimmunization status: Secondary | ICD-10-CM

## 2017-12-14 DIAGNOSIS — O09899 Supervision of other high risk pregnancies, unspecified trimester: Secondary | ICD-10-CM

## 2017-12-14 DIAGNOSIS — O99612 Diseases of the digestive system complicating pregnancy, second trimester: Secondary | ICD-10-CM | POA: Insufficient documentation

## 2017-12-14 DIAGNOSIS — O9A212 Injury, poisoning and certain other consequences of external causes complicating pregnancy, second trimester: Secondary | ICD-10-CM | POA: Diagnosis not present

## 2017-12-14 DIAGNOSIS — Z3480 Encounter for supervision of other normal pregnancy, unspecified trimester: Secondary | ICD-10-CM

## 2017-12-14 DIAGNOSIS — Z2839 Other underimmunization status: Secondary | ICD-10-CM

## 2017-12-14 DIAGNOSIS — E559 Vitamin D deficiency, unspecified: Secondary | ICD-10-CM

## 2017-12-14 LAB — URINALYSIS, ROUTINE W REFLEX MICROSCOPIC
Bilirubin Urine: NEGATIVE
Glucose, UA: 50 mg/dL — AB
Hgb urine dipstick: NEGATIVE
KETONES UR: 20 mg/dL — AB
Leukocytes, UA: NEGATIVE
Nitrite: NEGATIVE
PH: 7 (ref 5.0–8.0)
Protein, ur: 30 mg/dL — AB
Specific Gravity, Urine: 1.01 (ref 1.005–1.030)

## 2017-12-14 MED ORDER — ACETAMINOPHEN 500 MG PO TABS
1000.0000 mg | ORAL_TABLET | Freq: Four times a day (QID) | ORAL | Status: DC | PRN
  Administered 2017-12-14: 1000 mg via ORAL
  Filled 2017-12-14: qty 2

## 2017-12-14 MED ORDER — CYCLOBENZAPRINE HCL 10 MG PO TABS
10.0000 mg | ORAL_TABLET | Freq: Three times a day (TID) | ORAL | Status: DC | PRN
  Administered 2017-12-14: 10 mg via ORAL
  Filled 2017-12-14: qty 1

## 2017-12-14 MED ORDER — VITAFOL-NANO 18-0.6-0.4 MG PO TABS: 1.0000 | ORAL_TABLET | Freq: Every day | ORAL | 12 refills | Status: DC

## 2017-12-14 NOTE — Patient Instructions (Signed)
Before [facility] Before your baby arrives it is important to:  Have all of the supplies that you will need to care for your baby.  Know where to go if there is an emergency.  Discuss the baby's arrival with other family members.  What supplies will I need?  It is recommended that you have the following supplies: Large Items  Crib.  Crib mattress.  Rear-facing infant car seat. If possible, have a trained professional check to make sure that it is installed correctly.  Feeding  6-8 bottles that are 4-5 oz in size.  6-8 nipples.  Bottle brush.  Sterilizer, or a large pan or kettle with a lid.  A way to boil and cool water.  If you will be breastfeeding: ? Breast pump. ? Nipple cream. ? Nursing bra. ? Breast pads. ? Breast shields.  If you will be formula feeding: ? Formula. ? Measuring cups. ? Measuring spoons.  Bathing  Mild baby soap and baby shampoo.  Petroleum jelly.  Soft cloth towel and washcloth.  Hooded towel.  Cotton balls.  Bath basin.  Other Supplies  Rectal thermometer.  Bulb syringe.  Baby wipes or washcloths for diaper changes.  Diaper bag.  Changing pad.  Clothing, including one-piece outfits and pajamas.  Baby nail clippers.  Receiving blankets.  Mattress pad and sheets for the crib.  Night-light for the baby's room.  Baby monitor.  2 or 3 pacifiers.  Either 24-36 cloth diapers and waterproof diaper covers or a box of disposable diapers. You may need to use as many as 10-12 diapers per day.  How do I prepare for an emergency? Prepare for an emergency by:  Knowing how to get to the nearest hospital.  Listing the phone numbers of your baby's health care providers near your home phone and in your cell phone.  How do I prepare my family?  Decide how to handle visitors.  If you have other children: ? Talk with them about the baby coming home. Ask them how they feel about it. ? Read a book together about  being a new big brother or sister. ? Find ways to let them help you prepare for the new baby. ? Have someone ready to care for them while you are in the hospital. This information is not intended to replace advice given to you by your health care provider. Make sure you discuss any questions you have with your health care provider. Document Released: 08/18/2008 Document Revised: 02/11/2016 Document Reviewed: 08/13/2014 Elsevier Interactive Patient Education  2018 [employer].  [employer] of the uterus can occur throughout pregnancy, but they are not always a sign that you are in labor. You may have practice contractions called Braxton Hicks contractions. These false labor contractions are sometimes confused with true labor. What are [place] contractions? Braxton Hicks contractions are tightening movements that occur in the muscles of the uterus before labor. Unlike true labor contractions, these contractions do not result in opening (dilation) and thinning of the cervix. Toward the end of pregnancy (32-34 weeks), Braxton Hicks contractions can happen more often and may become stronger. These contractions are sometimes difficult to tell apart from true labor because they can be very uncomfortable. You should not feel embarrassed if you go to the hospital with false labor. Sometimes, the only way to tell if you are in true labor is for your health care provider to look for changes in the cervix. The health care provider will do a  physical exam and may monitor your contractions. If you are not in true labor, the exam should show that your cervix is not dilating and your water has not broken. If there are other health problems associated with your pregnancy, it is completely safe for you to be sent home with false labor. You may continue to have Braxton Hicks contractions until you go into true labor. How to tell the difference between true labor and false labor True  labor  Contractions last 30-70 seconds.  Contractions become very regular.  Discomfort is usually felt in the top of the uterus, and it spreads to the lower abdomen and low back.  Contractions do not go away with walking.  Contractions usually become more intense and increase in frequency.  The cervix dilates and gets thinner. False labor  Contractions are usually shorter and not as strong as true labor contractions.  Contractions are usually irregular.  Contractions are often felt in the front of the lower abdomen and in the groin.  Contractions may go away when you walk around or change positions while lying down.  Contractions get weaker and are shorter-lasting as time goes on.  The cervix usually does not dilate or become thin. Follow these instructions at home:  Take over-the-counter and prescription medicines only as told by your health care provider.  Keep up with your usual exercises and follow other instructions from your health care provider.  Eat and drink lightly if you think you are going into labor.  If Braxton Hicks contractions are making you uncomfortable: ? Change your position from lying down or resting to walking, or change from walking to resting. ? Sit and rest in a tub of warm water. ? Drink enough fluid to keep your urine pale yellow. Dehydration may cause these contractions. ? Do slow and deep breathing several times an hour.  Keep all follow-up prenatal visits as told by your health care provider. This is important. Contact a health care provider if:  You have a fever.  You have continuous pain in your abdomen. Get help right away if:  Your contractions become stronger, more regular, and closer together.  You have fluid leaking or gushing from your vagina.  You pass blood-tinged mucus (bloody show).  You have bleeding from your vagina.  You have low back pain that you never had before.  You feel your baby's head pushing down and  causing pelvic pressure.  Your baby is not moving inside you as much as it used to. Summary  Contractions that occur before labor are called Braxton Hicks contractions, false labor, or practice contractions.  Braxton Hicks contractions are usually shorter, weaker, farther apart, and less regular than true labor contractions. True labor contractions usually become progressively stronger and regular and they become more frequent.  Manage discomfort from [facility] contractions by changing position, resting in a warm bath, drinking plenty of water, or practicing deep breathing. This information is not intended to replace advice given to you by your health care provider. Make sure you discuss any questions you have with your health care provider. Document Released: 01/19/2017 Document Revised: 01/19/2017 Document Reviewed: 01/19/2017 Elsevier Interactive Patient Education  2018 [employer].  Heartburn During Pregnancy Heartburn is pain or discomfort in the throat or chest. It may cause a burning feeling. It happens when stomach acid moves up into the tube that carries food from your mouth to your stomach (esophagus). Heartburn is common during pregnancy. It usually goes away or gets better after  giving birth. Follow these instructions at home: Eating and drinking  Do not drink alcohol while you are pregnant.  Figure out which foods and beverages make you feel worse, and avoid them.  Beverages that you may want to avoid include: ? Coffee and tea (with or without caffeine). ? Energy drinks and sports drinks. ? Bubbly (carbonated) drinks or sodas. ? Citrus fruit juices.  Foods that you may want to avoid include: ? Chocolate and cocoa. ? Peppermint and mint flavorings. ? Garlic, onions, and horseradish. ? Spicy and acidic foods. These include peppers, chili powder, curry powder, vinegar, hot sauces, and barbecue sauce. ? Citrus fruits, such as oranges, lemons, and  limes. ? Tomato-based foods, such as red sauce, chili, and salsa. ? Fried and fatty foods, such as donuts, french fries, potato chips, and high-fat dressings. ? High-fat meats, such as hot dogs, cold cuts, sausage, ham, and bacon. ? High-fat dairy items, such as whole milk, butter, and cheese.  Eat small meals often, instead of large meals.  Avoid drinking a lot of liquid with your meals.  Avoid eating meals during the 2-3 hours before you go to bed.  Avoid lying down right after you eat.  Do not exercise right after you eat. Medicines  Take over-the-counter and prescription medicines only as told by your doctor.  Do not take aspirin, ibuprofen, or other NSAIDs unless your doctor tells you to do that.  Your doctor may tell you to avoid medicines that have sodium bicarbonate in them. General instructions  If told, raise the head of your bed about 6 inches (15 cm). You can do this by putting blocks under the legs. Sleeping with more pillows does not help with heartburn.  Do not use any products that contain nicotine or tobacco, such as cigarettes and e-cigarettes. If you need help quitting, ask your doctor.  Wear loose-fitting clothing.  Try to lower your stress, such as with yoga or meditation. If you need help, ask your doctor.  Stay at a healthy weight. If you are overweight, work with your doctor to safely lose weight.  Keep all follow-up visits as told by your doctor. This is important. Contact a doctor if:  You get new symptoms.  Your symptoms do not get better with treatment.  You have weight loss and you do not know why.  You have trouble swallowing.  You make loud sounds when you breathe (wheeze).  You have a cough that does not go away.  You have heartburn often for more than 2 weeks.  You feel sick to your stomach (nauseous), and this does not get better with treatment.  You are throwing up (vomiting), and this does not get better with treatment.  You  have pain in your belly (abdomen). Get help right away if:  You have very bad chest pain that spreads to your arm, neck, or jaw.  You feel sweaty, dizzy, or light-headed.  You have trouble breathing.  You have pain when swallowing.  You throw up and your throw-up looks like blood or coffee grounds.  Your poop (stool) is bloody or black. This information is not intended to replace advice given to you by your health care provider. Make sure you discuss any questions you have with your health care provider. Document Released: 10/08/2010 Document Revised: 05/23/2016 Document Reviewed: 05/23/2016 Elsevier Interactive Patient Education  2017 [employer].  Preterm Labor and Birth Information Pregnancy normally lasts 39-41 weeks. Preterm labor is when labor starts early. It starts before  you have been pregnant for 37 whole weeks. What are the risk factors for preterm labor? Preterm labor is more likely to occur in women who:  Have an infection while pregnant.  Have a cervix that is short.  Have gone into preterm labor before.  Have had surgery on their cervix.  Are younger than age 34.  Are older than age 46.  Are African American.  Are pregnant with two or more babies.  Take street drugs while pregnant.  Smoke while pregnant.  Do not gain enough weight while pregnant.  Got pregnant right after another pregnancy.  What are the symptoms of preterm labor? Symptoms of preterm labor include:  Cramps. The cramps may feel like the cramps some women get during their period. The cramps may happen with watery poop (diarrhea).  Pain in the belly (abdomen).  Pain in the lower back.  Regular contractions or tightening. It may feel like your belly is getting tighter.  Pressure in the lower belly that seems to get stronger.  More fluid (discharge) leaking from the vagina. The fluid may be watery or bloody.  Water breaking.  Why is it important to notice signs of preterm  labor? Babies who are born early may not be fully developed. They have a higher chance for:  Long-term heart problems.  Long-term lung problems.  Trouble controlling body systems, like breathing.  Bleeding in the brain.  A condition called cerebral palsy.  Learning difficulties.  Death.  These risks are highest for babies who are born before 34 weeks of pregnancy. How is preterm labor treated? Treatment depends on:  How long you were pregnant.  Your condition.  The health of your baby.  Treatment may involve:  Having a stitch (suture) placed in your cervix. When you give birth, your cervix opens so the baby can come out. The stitch keeps the cervix from opening too soon.  Staying at the hospital.  Taking or getting medicines, such as: ? Hormone medicines. ? Medicines to stop contractions. ? Medicines to help the baby's lungs develop. ? Medicines to prevent your baby from having cerebral palsy.  What should I do if I am in preterm labor? If you think you are going into labor too soon, call your doctor right away. How can I prevent preterm labor?  Do not use any tobacco products. ? Examples of these are cigarettes, chewing tobacco, and e-cigarettes. ? If you need help quitting, ask your doctor.  Do not use street drugs.  Do not use any medicines unless you ask your doctor if they are safe for you.  Talk with your doctor before taking any herbal supplements.  Make sure you gain enough weight.  Watch for infection. If you think you might have an infection, get it checked right away.  If you have gone into preterm labor before, tell your doctor. This information is not intended to replace advice given to you by your health care provider. Make sure you discuss any questions you have with your health care provider. Document Released: 12/02/2008 Document Revised: 02/16/2016 Document Reviewed: 01/27/2016 Elsevier Interactive Patient Education  2018 [employer].  Third Trimester of Pregnancy The third trimester is from week 28 through week 40 (months 7 through 9). The third trimester is a time when the unborn baby (fetus) is growing rapidly. At the end of the ninth month, the fetus is about 20 inches in length and weighs 6-10 pounds. Body changes during your third trimester Your body will continue to  go through many changes during pregnancy. The changes vary from woman to woman. During the third trimester:  Your weight will continue to increase. You can expect to gain 25-35 pounds (11-16 kg) by the end of the pregnancy.  You may begin to get stretch marks on your hips, abdomen, and breasts.  You may urinate more often because the fetus is moving lower into your pelvis and pressing on your bladder.  You may develop or continue to have heartburn. This is caused by increased hormones that slow down muscles in the digestive tract.  You may develop or continue to have constipation because increased hormones slow digestion and cause the muscles that push waste through your intestines to relax.  You may develop hemorrhoids. These are swollen veins (varicose veins) in the rectum that can itch or be painful.  You may develop swollen, bulging veins (varicose veins) in your legs.  You may have increased body aches in the pelvis, back, or thighs. This is due to weight gain and increased hormones that are relaxing your joints.  You may have changes in your hair. These can include thickening of your hair, rapid growth, and changes in texture. Some women also have hair loss during or after pregnancy, or hair that feels dry or thin. Your hair will most likely return to normal after your baby is born.  Your breasts will continue to grow and they will continue to become tender. A yellow fluid (colostrum) may leak from your breasts. This is the first milk you are producing for your baby.  Your belly button may stick out.  You may notice more swelling in your  hands, face, or ankles.  You may have increased tingling or numbness in your hands, arms, and legs. The skin on your belly may also feel numb.  You may feel short of breath because of your expanding uterus.  You may have more problems sleeping. This can be caused by the size of your belly, increased need to urinate, and an increase in your body's metabolism.  You may notice the fetus "dropping," or moving lower in your abdomen (lightening).  You may have increased vaginal discharge.  You may notice your joints feel loose and you may have pain around your pelvic bone.  What to expect at prenatal visits You will have prenatal exams every 2 weeks until week 36. Then you will have weekly prenatal exams. During a routine prenatal visit:  You will be weighed to make sure you and the baby are growing normally.  Your blood pressure will be taken.  Your abdomen will be measured to track your baby's growth.  The fetal heartbeat will be listened to.  Any test results from the previous visit will be discussed.  You may have a cervical check near your due date to see if your cervix has softened or thinned (effaced).  You will be tested for Group B streptococcus. This happens between 35 and 37 weeks.  Your health care provider may ask you:  What your birth plan is.  How you are feeling.  If you are feeling the baby move.  If you have had any abnormal symptoms, such as leaking fluid, bleeding, severe headaches, or abdominal cramping.  If you are using any tobacco products, including cigarettes, chewing tobacco, and electronic cigarettes.  If you have any questions.  Other tests or screenings that may be performed during your third trimester include:  Blood tests that check for low iron levels (anemia).  Fetal testing to  check the health, activity level, and growth of the fetus. Testing is done if you have certain medical conditions or if there are problems during the  pregnancy.  Nonstress test (NST). This test checks the health of your baby to make sure there are no signs of problems, such as the baby not getting enough oxygen. During this test, a belt is placed around your belly. The baby is made to move, and its heart rate is monitored during movement.  What is false labor? False labor is a condition in which you feel small, irregular tightenings of the muscles in the womb (contractions) that usually go away with rest, changing position, or drinking water. These are called Braxton Hicks contractions. Contractions may last for hours, days, or even weeks before true labor sets in. If contractions come at regular intervals, become more frequent, increase in intensity, or become painful, you should see your health care provider. What are the signs of labor?  Abdominal cramps.  Regular contractions that start at 10 minutes apart and become stronger and more frequent with time.  Contractions that start on the top of the uterus and spread down to the lower abdomen and back.  Increased pelvic pressure and dull back pain.  A watery or bloody mucus discharge that comes from the vagina.  Leaking of amniotic fluid. This is also known as your "water breaking." It could be a slow trickle or a gush. Let your health care provider know if it has a color or strange odor. If you have any of these signs, call your health care provider right away, even if it is before your due date. Follow these instructions at home: Medicines  Follow your health care provider's instructions regarding medicine use. Specific medicines may be either safe or unsafe to take during pregnancy.  Take a prenatal vitamin that contains at least 600 micrograms (mcg) of folic acid.  If you develop constipation, try taking a stool softener if your health care provider approves. Eating and drinking  Eat a balanced diet that includes fresh fruits and vegetables, whole grains, good sources of protein  such as meat, eggs, or tofu, and low-fat dairy. Your health care provider will help you determine the amount of weight gain that is right for you.  Avoid raw meat and uncooked cheese. These carry germs that can cause birth defects in the baby.  If you have low calcium intake from food, talk to your health care provider about whether you should take a daily calcium supplement.  Eat four or five small meals rather than three large meals a day.  Limit foods that are high in fat and processed sugars, such as fried and sweet foods.  To prevent constipation: ? Drink enough fluid to keep your urine clear or pale yellow. ? Eat foods that are high in fiber, such as fresh fruits and vegetables, whole grains, and beans. Activity  Exercise only as directed by your health care provider. Most women can continue their usual exercise routine during pregnancy. Try to exercise for 30 minutes at least 5 days a week. Stop exercising if you experience uterine contractions.  Avoid heavy lifting.  Do not exercise in extreme heat or humidity, or at high altitudes.  Wear low-heel, comfortable shoes.  Practice good posture.  You may continue to have sex unless your health care provider tells you otherwise. Relieving pain and discomfort  Take frequent breaks and rest with your legs elevated if you have leg cramps or low back pain.  Take warm sitz baths to soothe any pain or discomfort caused by hemorrhoids. Use hemorrhoid cream if your health care provider approves.  Wear a good support bra to prevent discomfort from breast tenderness.  If you develop varicose veins: ? Wear support pantyhose or compression stockings as told by your healthcare provider. ? Elevate your feet for 15 minutes, 3-4 times a day. Prenatal care  Write down your questions. Take them to your prenatal visits.  Keep all your prenatal visits as told by your health care provider. This is important. Safety  Wear your seat belt at  all times when driving.  Make a list of emergency phone numbers, including numbers for family, friends, the hospital, and police and fire departments. General instructions  Avoid cat litter boxes and soil used by cats. These carry germs that can cause birth defects in the baby. If you have a cat, ask someone to clean the litter box for you.  Do not travel far distances unless it is absolutely necessary and only with the approval of your health care provider.  Do not use hot tubs, steam rooms, or saunas.  Do not drink alcohol.  Do not use any products that contain nicotine or tobacco, such as cigarettes and e-cigarettes. If you need help quitting, ask your health care provider.  Do not use any medicinal herbs or unprescribed drugs. These chemicals affect the formation and growth of the baby.  Do not douche or use tampons or scented sanitary pads.  Do not cross your legs for long periods of time.  To prepare for the arrival of your baby: ? Take prenatal classes to understand, practice, and ask questions about labor and delivery. ? Make a trial run to the hospital. ? Visit the hospital and tour the maternity area. ? Arrange for maternity or paternity leave through employers. ? Arrange for family and friends to take care of pets while you are in the hospital. ? Purchase a rear-facing car seat and make sure you know how to install it in your car. ? Pack your hospital bag. ? Prepare the baby's nursery. Make sure to remove all pillows and stuffed animals from the baby's crib to prevent suffocation.  Visit your dentist if you have not gone during your pregnancy. Use a soft toothbrush to brush your teeth and be gentle when you floss. Contact a health care provider if:  You are unsure if you are in labor or if your water has broken.  You become dizzy.  You have mild pelvic cramps, pelvic pressure, or nagging pain in your abdominal area.  You have lower back pain.  You have persistent  nausea, vomiting, or diarrhea.  You have an unusual or bad smelling vaginal discharge.  You have pain when you urinate. Get help right away if:  Your water breaks before 37 weeks.  You have regular contractions less than 5 minutes apart before 37 weeks.  You have a fever.  You are leaking fluid from your vagina.  You have spotting or bleeding from your vagina.  You have severe abdominal pain or cramping.  You have rapid weight loss or weight gain.  You have shortness of breath with chest pain.  You notice sudden or extreme swelling of your face, hands, ankles, feet, or legs.  Your baby makes fewer than 10 movements in 2 hours.  You have severe headaches that do not go away when you take medicine.  You have vision changes. Summary  The third trimester is from week  28 through week 40, months 7 through 9. The third trimester is a time when the unborn baby (fetus) is growing rapidly.  During the third trimester, your discomfort may increase as you and your baby continue to gain weight. You may have abdominal, leg, and back pain, sleeping problems, and an increased need to urinate.  During the third trimester your breasts will keep growing and they will continue to become tender. A yellow fluid (colostrum) may leak from your breasts. This is the first milk you are producing for your baby.  False labor is a condition in which you feel small, irregular tightenings of the muscles in the womb (contractions) that eventually go away. These are called Braxton Hicks contractions. Contractions may last for hours, days, or even weeks before true labor sets in.  Signs of labor can include: abdominal cramps; regular contractions that start at 10 minutes apart and become stronger and more frequent with time; watery or bloody mucus discharge that comes from the vagina; increased pelvic pressure and dull back pain; and leaking of amniotic fluid. This information is not intended to replace advice  given to you by your health care provider. Make sure you discuss any questions you have with your health care provider. Document Released: 08/30/2001 Document Revised: 02/11/2016 Document Reviewed: 11/06/2012 Elsevier Interactive Patient Education  2017 [employer]

## 2017-12-14 NOTE — Progress Notes (Signed)
ROB/GTT.  Declined TDAP. 

## 2017-12-14 NOTE — Discharge Instructions (Signed)
Braxton Hicks Contractions °Contractions of the uterus can occur throughout pregnancy, but they are not always a sign that you are in labor. You may have practice contractions called Braxton Hicks contractions. These false labor contractions are sometimes confused with true labor. °What are Braxton Hicks contractions? °Braxton Hicks contractions are tightening movements that occur in the muscles of the uterus before labor. Unlike true labor contractions, these contractions do not result in opening (dilation) and thinning of the cervix. Toward the end of pregnancy (32-34 weeks), Braxton Hicks contractions can happen more often and may become stronger. These contractions are sometimes difficult to tell apart from true labor because they can be very uncomfortable. You should not feel embarrassed if you go to the hospital with false labor. °Sometimes, the only way to tell if you are in true labor is for your health care provider to look for changes in the cervix. The health care provider will do a physical exam and may monitor your contractions. If you are not in true labor, the exam should show that your cervix is not dilating and your water has not broken. °If there are other health problems associated with your pregnancy, it is completely safe for you to be sent home with false labor. You may continue to have Braxton Hicks contractions until you go into true labor. °How to tell the difference between true labor and false labor °True labor °· Contractions last 30-70 seconds. °· Contractions become very regular. °· Discomfort is usually felt in the top of the uterus, and it spreads to the lower abdomen and low back. °· Contractions do not go away with walking. °· Contractions usually become more intense and increase in frequency. °· The cervix dilates and gets thinner. °False labor °· Contractions are usually shorter and not as strong as true labor contractions. °· Contractions are usually irregular. °· Contractions  are often felt in the front of the lower abdomen and in the groin. °· Contractions may go away when you walk around or change positions while lying down. °· Contractions get weaker and are shorter-lasting as time goes on. °· The cervix usually does not dilate or become thin. °Follow these instructions at home: °· Take over-the-counter and prescription medicines only as told by your health care provider. °· Keep up with your usual exercises and follow other instructions from your health care provider. °· Eat and drink lightly if you think you are going into labor. °· If Braxton Hicks contractions are making you uncomfortable: °? Change your position from lying down or resting to walking, or change from walking to resting. °? Sit and rest in a tub of warm water. °? Drink enough fluid to keep your urine pale yellow. Dehydration may cause these contractions. °? Do slow and deep breathing several times an hour. °· Keep all follow-up prenatal visits as told by your health care provider. This is important. °Contact a health care provider if: °· You have a fever. °· You have continuous pain in your abdomen. °Get help right away if: °· Your contractions become stronger, more regular, and closer together. °· You have fluid leaking or gushing from your vagina. °· You pass blood-tinged mucus (bloody show). °· You have bleeding from your vagina. °· You have low back pain that you never had before. °· You feel your baby’s head pushing down and causing pelvic pressure. °· Your baby is not moving inside you as much as it used to. °Summary °· Contractions that occur before labor are called Braxton   Hicks contractions, false labor, or practice contractions. °· Braxton Hicks contractions are usually shorter, weaker, farther apart, and less regular than true labor contractions. True labor contractions usually become progressively stronger and regular and they become more frequent. °· Manage discomfort from Braxton Hicks contractions by  changing position, resting in a warm bath, drinking plenty of water, or practicing deep breathing. °This information is not intended to replace advice given to you by your health care provider. Make sure you discuss any questions you have with your health care provider. °Document Released: 01/19/2017 Document Revised: 01/19/2017 Document Reviewed: 01/19/2017 °Elsevier Interactive Patient Education © 2018 Elsevier Inc. °Round Ligament Pain °The round ligament is a cord of muscle and tissue that helps to support the uterus. It can become a source of pain during pregnancy if it becomes stretched or twisted as the baby grows. The pain usually begins in the second trimester of pregnancy, and it can come and go until the baby is delivered. It is not a serious problem, and it does not cause harm to the baby. °Round ligament pain is usually a short, sharp, and pinching pain, but it can also be a dull, lingering, and aching pain. The pain is felt in the lower side of the abdomen or in the groin. It usually starts deep in the groin and moves up to the outside of the hip area. Pain can occur with: °· A sudden change in position. °· Rolling over in bed. °· Coughing or sneezing. °· Physical activity. ° °Follow these instructions at home: °Watch your condition for any changes. Take these steps to help with your pain: °· When the pain starts, relax. Then try: °? Sitting down. °? Flexing your knees up to your abdomen. °? Lying on your side with one pillow under your abdomen and another pillow between your legs. °? Sitting in a warm bath for 15-20 minutes or until the pain goes away. °· Take over-the-counter and prescription medicines only as told by your health care provider. °· Move slowly when you sit and stand. °· Avoid long walks if they cause pain. °· Stop or lessen your physical activities if they cause pain. ° °Contact a health care provider if: °· Your pain does not go away with treatment. °· You feel pain in your back that  you did not have before. °· Your medicine is not helping. °Get help right away if: °· You develop a fever or chills. °· You develop uterine contractions. °· You develop vaginal bleeding. °· You develop nausea or vomiting. °· You develop diarrhea. °· You have pain when you urinate. °This information is not intended to replace advice given to you by your health care provider. Make sure you discuss any questions you have with your health care provider. °Document Released: 06/14/2008 Document Revised: 02/11/2016 Document Reviewed: 11/12/2014 °Elsevier Interactive Patient Education © 2018 Elsevier Inc. ° °
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
## 2017-12-14 NOTE — MAU Note (Signed)
Arrival via EMS with complaint of abd pain that started  30-40 minutes ago, denies bleeding.

## 2017-12-15 LAB — GLUCOSE TOLERANCE, 2 HOURS W/ 1HR
GLUCOSE, 1 HOUR: 154 mg/dL (ref 65–179)
GLUCOSE, FASTING: 73 mg/dL (ref 65–91)
Glucose, 2 hour: 150 mg/dL (ref 65–152)

## 2017-12-15 LAB — CBC
HEMOGLOBIN: 11 g/dL — AB (ref 11.1–15.9)
Hematocrit: 33.6 % — ABNORMAL LOW (ref 34.0–46.6)
MCH: 29.4 pg (ref 26.6–33.0)
MCHC: 32.7 g/dL (ref 31.5–35.7)
MCV: 90 fL (ref 79–97)
PLATELETS: 207 10*3/uL (ref 150–379)
RBC: 3.74 x10E6/uL — ABNORMAL LOW (ref 3.77–5.28)
RDW: 13.7 % (ref 12.3–15.4)
WBC: 7.9 10*3/uL (ref 3.4–10.8)

## 2017-12-15 LAB — HIV ANTIBODY (ROUTINE TESTING W REFLEX): HIV SCREEN 4TH GENERATION: NONREACTIVE

## 2017-12-15 LAB — RPR: RPR Ser Ql: NONREACTIVE

## 2017-12-20 ENCOUNTER — Other Ambulatory Visit: Payer: Self-pay | Admitting: Certified Nurse Midwife

## 2017-12-20 DIAGNOSIS — Z3480 Encounter for supervision of other normal pregnancy, unspecified trimester: Secondary | ICD-10-CM

## 2017-12-21 ENCOUNTER — Other Ambulatory Visit (HOSPITAL_COMMUNITY): Payer: Self-pay | Admitting: *Deleted

## 2017-12-21 ENCOUNTER — Encounter (HOSPITAL_COMMUNITY): Payer: Self-pay

## 2017-12-21 ENCOUNTER — Ambulatory Visit (HOSPITAL_COMMUNITY)
Admission: RE | Admit: 2017-12-21 | Discharge: 2017-12-21 | Disposition: A | Discharge status: Home / Self Care | Payer: Medicaid Other | Source: Ambulatory Visit | Attending: Certified Nurse Midwife | Admitting: Certified Nurse Midwife

## 2017-12-21 ENCOUNTER — Other Ambulatory Visit (HOSPITAL_COMMUNITY): Payer: Self-pay | Admitting: Maternal and Fetal Medicine

## 2017-12-21 DIAGNOSIS — Z362 Encounter for other antenatal screening follow-up: Secondary | ICD-10-CM

## 2017-12-21 DIAGNOSIS — O365930 Maternal care for other known or suspected poor fetal growth, third trimester, not applicable or unspecified: Secondary | ICD-10-CM

## 2017-12-21 DIAGNOSIS — Z3A28 28 weeks gestation of pregnancy: Secondary | ICD-10-CM

## 2017-12-21 DIAGNOSIS — Z8279 Family history of other congenital malformations, deformations and chromosomal abnormalities: Secondary | ICD-10-CM | POA: Insufficient documentation

## 2017-12-28 ENCOUNTER — Encounter: Payer: Self-pay | Admitting: Certified Nurse Midwife

## 2017-12-28 ENCOUNTER — Ambulatory Visit (INDEPENDENT_AMBULATORY_CARE_PROVIDER_SITE_OTHER): Payer: Medicaid Other | Admitting: Certified Nurse Midwife

## 2017-12-28 ENCOUNTER — Other Ambulatory Visit (HOSPITAL_COMMUNITY)
Admission: RE | Admit: 2017-12-28 | Discharge: 2017-12-28 | Disposition: A | Discharge status: Home / Self Care | Payer: Medicaid Other | Source: Ambulatory Visit | Attending: Certified Nurse Midwife | Admitting: Certified Nurse Midwife

## 2017-12-28 DIAGNOSIS — Z3480 Encounter for supervision of other normal pregnancy, unspecified trimester: Secondary | ICD-10-CM

## 2017-12-28 DIAGNOSIS — Z3A29 29 weeks gestation of pregnancy: Secondary | ICD-10-CM | POA: Diagnosis not present

## 2017-12-28 DIAGNOSIS — Z3483 Encounter for supervision of other normal pregnancy, third trimester: Secondary | ICD-10-CM | POA: Diagnosis not present

## 2017-12-28 MED ORDER — VITAFOL-NANO 18-0.6-0.4 MG PO TABS: 1.0000 | ORAL_TABLET | Freq: Every day | ORAL | 12 refills | Status: DC

## 2017-12-28 NOTE — Patient Instructions (Signed)
AREA PEDIATRIC/FAMILY PRACTICE PHYSICIANS  [facility] [address], [address] [place], [place]  [address] Phone - [phone]   Fax - [phone]  [facility] [address] [place], [place] [address] Phone - [phone]   Fax - [phone]  [facility] [address] [place], [place]  [address] Phone - [phone]   Fax - [phone]  [facility] [address] [place], [place]  [address] Phone - [phone]   Fax - [phone]  [facility] [address] [place], [place]  [address] Phone - [phone]   Fax - [phone]  [facility] [address] [place], [place]  [address] Phone - [phone]   Fax - [phone]  [facility] [address] [place], [place]  [address] Phone - [phone]   Fax - [phone]  [facility] [address] [place], [place]  [address] Phone - [phone]   Fax - [phone]  [facility] [address] [place], [place]  [address] Phone - [phone]   Fax - [phone] [facility] [address] [place], [place]  [address] Phone - [phone]   Fax - [phone]  [facility] [address] [place], [place]  [address] Phone - [phone]   Fax - [phone]  [facility] [address] [place], [place]  [address] Phone - [phone]   Fax - [phone]  [facility] [address] [place], [place]  [address] Phone - [phone]   Fax - [phone]  [facility] [address] [place], [place]  [address] Phone - [phone]  [facility] [address] [place], [place]  [address] Phone - [phone]   Fax - [phone]  [facility] [address] [place], [place]  [address] Phone - [phone]   Fax - [phone]  [facility] [address] [place] [address] [place], [place]  [address] Phone - [phone]   Fax - [phone]  [facility] [address] [place], [place]  [address] Phone - [phone]   Fax - [phone]   [facility] [address] [place], [place]  [address] Phone - [phone]   Fax - [phone]  [facility] [address] [place], [place]  [address] Phone - [phone]   Fax - [phone]  [facility] [address] [place], [place]  [address] Phone - [phone]   Fax - [phone]  [facility] [address] [place], [place]  [address] Phone - [phone]   Fax - [phone]  [facility] [address] , [place]  [address] Phone - [phone]   Fax - [phone] [facility] [address] [place], [place]  [address] Phone - [phone]   Fax - [phone]  [facility] [address] [place], [place]  [address] Phone - [phone]   Fax - [phone]  [facility] [address] [place], [place]  [address] Phone - [phone]   Fax - [phone]  [facility] Charlene Flemming MD [address] [place]  [address] Phone [phone]  Fax [phone]   
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
## 2017-12-29 LAB — CERVICOVAGINAL ANCILLARY ONLY
BACTERIAL VAGINITIS: NEGATIVE
Candida vaginitis: NEGATIVE

## 2018-01-06 ENCOUNTER — Inpatient Hospital Stay (HOSPITAL_COMMUNITY)
Admission: AD | Admit: 2018-01-06 | Discharge: 2018-01-06 | Disposition: A | Discharge status: Home / Self Care | Payer: Medicaid Other | Source: Ambulatory Visit | Attending: Family Medicine | Admitting: Family Medicine

## 2018-01-06 ENCOUNTER — Encounter (HOSPITAL_COMMUNITY): Payer: Self-pay | Admitting: *Deleted

## 2018-01-06 DIAGNOSIS — Z3A31 31 weeks gestation of pregnancy: Secondary | ICD-10-CM | POA: Diagnosis not present

## 2018-01-06 DIAGNOSIS — O26893 Other specified pregnancy related conditions, third trimester: Secondary | ICD-10-CM | POA: Diagnosis not present

## 2018-01-06 DIAGNOSIS — O26899 Other specified pregnancy related conditions, unspecified trimester: Secondary | ICD-10-CM

## 2018-01-06 DIAGNOSIS — R102 Pelvic and perineal pain: Secondary | ICD-10-CM | POA: Diagnosis not present

## 2018-01-06 DIAGNOSIS — Z87891 Personal history of nicotine dependence: Secondary | ICD-10-CM | POA: Diagnosis not present

## 2018-01-06 DIAGNOSIS — R109 Unspecified abdominal pain: Secondary | ICD-10-CM | POA: Diagnosis present

## 2018-01-06 DIAGNOSIS — O99283 Endocrine, nutritional and metabolic diseases complicating pregnancy, third trimester: Secondary | ICD-10-CM | POA: Insufficient documentation

## 2018-01-06 DIAGNOSIS — E860 Dehydration: Secondary | ICD-10-CM | POA: Diagnosis not present

## 2018-01-06 HISTORY — DX: Headache, unspecified: R51.9

## 2018-01-06 HISTORY — DX: Headache: R51

## 2018-01-06 LAB — URINALYSIS, ROUTINE W REFLEX MICROSCOPIC
Bilirubin Urine: NEGATIVE
Glucose, UA: NEGATIVE mg/dL
Hgb urine dipstick: NEGATIVE
Ketones, ur: 80 mg/dL — AB
Leukocytes, UA: NEGATIVE
Nitrite: NEGATIVE
Protein, ur: 100 mg/dL — AB
Specific Gravity, Urine: 1.025 (ref 1.005–1.030)
pH: 6 (ref 5.0–8.0)

## 2018-01-06 MED ORDER — ACETAMINOPHEN 500 MG PO TABS
1000.0000 mg | ORAL_TABLET | Freq: Once | ORAL | Status: AC
  Administered 2018-01-06: 1000 mg via ORAL
  Filled 2018-01-06: qty 2

## 2018-01-06 MED ORDER — DEXTROSE 5 % IN LACTATED RINGERS IV BOLUS
1000.0000 mL | Freq: Once | INTRAVENOUS | Status: AC
  Administered 2018-01-06: 1000 mL via INTRAVENOUS

## 2018-01-06 MED ORDER — LACTATED RINGERS IV BOLUS: 1000.0000 mL | Freq: Once | INTRAVENOUS | Status: DC

## 2018-01-06 MED ORDER — COMFORT FIT MATERNITY SUPP MED MISC: 1.0000 | Freq: Every day | 0 refills | Status: DC

## 2018-01-06 NOTE — MAU Provider Note (Signed)
Chief Complaint:  Abdominal Pain   First Provider Initiated Contact with Patient 01/06/18 0221      HPI: Allison Hamilton is a 22 y.o. G3P0020 at 24w0dwho presents to maternity admissions via EMS reporting abdominal pain. She reports sharp abdominal pain in her lower abdomen that gets worse when she moves, stands or walks around. Has tried heating pads and soaking in tub with little relief. Has not taken medication for pain. Rates pain 7/10. Pain has been intermittent since Friday. She reports good fetal movement, denies LOF, vaginal bleeding, vaginal discharge, vaginal itching/burning, urinary symptoms, h/a, dizziness, n/v, or fever/chills. She reports not drinking water, "does not like the taste", she reports drinking mostly sweet tea and juices. Has stopped drinking sodas in the past few weeks. She receives prenatal care at [facility].   Past Medical History: Past Medical History:  Diagnosis Date  . Headache     Past obstetric history: OB History  Gravida Para Term Preterm AB Living  3       2 0  SAB TAB Ectopic Multiple Live Births  2 0          # Outcome Date GA Lbr Len/2nd Weight Sex Delivery Anes PTL Lv  3 Current           2 SAB 08/2015             Complications: Pregnancy, twins  1 SAB 04/2015            Past Surgical History: Past Surgical History:  Procedure Laterality Date  . NO PAST SURGERIES      Family History: History reviewed. No pertinent family history.  Social History: Social History   Tobacco Use  . Smoking status: Former Smoker    Types: Cigarettes    Last attempt to quit: 07/2017    Years since quitting: 0.4  . Smokeless tobacco: Never Used  Substance Use Topics  . Alcohol use: No    Frequency: Never  . Drug use: No    Allergies: No Known Allergies  Meds:  Medications Prior to Admission  Medication Sig Dispense Refill Last Dose  . acetaminophen (TYLENOL) 500 MG tablet Take 500 mg by mouth every 6 (six) hours as needed.   Past Week at Unknown  time  . Doxylamine-Pyridoxine (DICLEGIS) 10-10 MG TBEC Take 1 tablet with breakfast and lunch.  Take 2 tablets at bedtime. 100 tablet 4 Past Week at Unknown time  . ondansetron (ZOFRAN) 8 MG tablet Take 1 tablet (8 mg total) by mouth every 8 (eight) hours as needed for nausea or vomiting. 40 tablet 2 01/05/2018 at Unknown time  . polyethylene glycol powder (GLYCOLAX/MIRALAX) powder Take 17 g by mouth daily as needed for moderate constipation. 500 g 2 Past Week at Unknown time  . Prenatal-Fe Fum-Methf-FA w/o A (VITAFOL-NANO) 18-0.6-0.4 MG TABS Take 1 tablet by mouth daily. 30 tablet 12 01/05/2018 at Unknown time  . Vitamin D, Ergocalciferol, (DRISDOL) 50000 units CAPS capsule Take 1 capsule (50,000 Units total) by mouth every 7 (seven) days. 30 capsule 2 Past Week at Unknown time  . diphenhydramine-acetaminophen (TYLENOL PM) 25-500 MG TABS tablet Take 2 tablets by mouth at bedtime as needed (sleep).   Taking  . promethazine (PHENERGAN) 25 MG tablet Take 1 tablet (25 mg total) by mouth every 6 (six) hours as needed for nausea or vomiting. 30 tablet 3 Taking    ROS:  Review of Systems  Constitutional: Negative.   Respiratory: Negative.   Cardiovascular: Negative.  Gastrointestinal: Positive for abdominal pain. Negative for constipation, diarrhea, nausea and vomiting.  Genitourinary: Negative.   Musculoskeletal: Negative.    I have reviewed patient's Past Medical Hx, Surgical Hx, Family Hx, Social Hx, medications and allergies.   Physical Exam   Patient Vitals for the past 24 hrs:  BP Temp Pulse Resp Height Weight  01/06/18 0402 (!) 102/48 97.8 F (36.6 C) 82 16 - -  01/06/18 0207 113/62 98.3 F (36.8 C) 92 18 5\' 2"  (1.575 m) 145 lb (65.8 kg)   Constitutional: Well-developed, well-nourished female in no acute distress.  Cardiovascular: normal rate Respiratory: normal effort GI: Abd soft, non-tender, gravid appropriate for gestational age.  MS: Extremities nontender, no edema, normal  ROM Neurologic: Alert and oriented x 4.  GU: Neg CVAT.  CERVICAL EXAM:  Dilation: Closed Effacement (%): Thick Exam by:: Steward Drone CNM  FHT:  Baseline 125 , moderate variability, accelerations present, no decelerations Contractions: q 3 mins prior to fluid bolus, UI after bolus of fluid    Labs: Results for orders placed or performed during the hospital encounter of 01/06/18 (from the past 24 hour(s))  Urinalysis, Routine w reflex microscopic     Status: Abnormal   Collection Time: 01/06/18  1:55 AM  Result Value Ref Range   Color, Urine YELLOW YELLOW   APPearance HAZY (A) CLEAR   Specific Gravity, Urine 1.025 1.005 - 1.030   pH 6.0 5.0 - 8.0   Glucose, UA NEGATIVE NEGATIVE mg/dL   Hgb urine dipstick NEGATIVE NEGATIVE   Bilirubin Urine NEGATIVE NEGATIVE   Ketones, ur 80 (A) NEGATIVE mg/dL   Protein, ur 952 (A) NEGATIVE mg/dL   Nitrite NEGATIVE NEGATIVE   Leukocytes, UA NEGATIVE NEGATIVE   RBC / HPF 0-5 0 - 5 RBC/hpf   WBC, UA 6-30 0 - 5 WBC/hpf   Bacteria, UA RARE (A) NONE SEEN   Squamous Epithelial / LPF 0-5 (A) NONE SEEN   Mucus PRESENT    --/--/A POS (03/19 2343)  MAU Course/MDM: Orders Placed This Encounter  Procedures  . Urinalysis, Routine w reflex microscopic  . Encourage fluids  . Insert peripheral IV    Meds ordered this encounter  Medications  . acetaminophen (TYLENOL) tablet 1,000 mg  . DISCONTD: lactated ringers bolus 1,000 mL  . dextrose 5% lactated ringers bolus 1,000 mL  . Elastic Bandages & Supports (COMFORT FIT MATERNITY SUPP MED) MISC    Sig: 1 Device by Does not apply route daily.    Dispense:  1 each    Refill:  0    Order Specific Question:   Supervising Provider    Answer:   Reva Bores [[id number]]    NST reviewed- reactive  Treatments in MAU included 1000mg  Tylenol for round ligament pain- reports decreased pain to 5/10. IV fluid bolus for 80 ketones and moderate dehydration- patient reports decreased pain to 3/10 after fluid bolus.     Educated on the need to increased amount of water she consumes to 8 8oz cups per day, can have other types of drinks but needs to consume more and mostly water. Patient verbalizes understanding.   Pt discharged. Pt stable at time of discharge.   Today's evaluation included a work-up for preterm labor which can be life-threatening for both mom and baby.  Assessment: 1. Pain of round ligament during pregnancy   2. Dehydration, moderate     Plan: Discharge home Preterm Labor precautions and fetal kick counts Follow up as scheduled for prenatal appointments  Return to MAU as needed for emergencies  Rx for maternity support belt    Allergies as of 01/06/2018   No Known Allergies     Medication List    TAKE these medications   acetaminophen 500 MG tablet Commonly known as:  TYLENOL Take 500 mg by mouth every 6 (six) hours as needed.   COMFORT FIT MATERNITY SUPP MED Misc 1 Device by Does not apply route daily.   diphenhydramine-acetaminophen 25-500 MG Tabs tablet Commonly known as:  TYLENOL PM Take 2 tablets by mouth at bedtime as needed (sleep).   Doxylamine-Pyridoxine 10-10 MG Tbec Commonly known as:  DICLEGIS Take 1 tablet with breakfast and lunch.  Take 2 tablets at bedtime.   ondansetron 8 MG tablet Commonly known as:  ZOFRAN Take 1 tablet (8 mg total) by mouth every 8 (eight) hours as needed for nausea or vomiting.   polyethylene glycol powder powder Commonly known as:  GLYCOLAX/MIRALAX Take 17 g by mouth daily as needed for moderate constipation.   promethazine 25 MG tablet Commonly known as:  PHENERGAN Take 1 tablet (25 mg total) by mouth every 6 (six) hours as needed for nausea or vomiting.   VITAFOL-NANO 18-0.6-0.4 MG Tabs Take 1 tablet by mouth daily.   Vitamin D (Ergocalciferol) 50000 units Caps capsule Commonly known as:  DRISDOL Take 1 capsule (50,000 Units total) by mouth every 7 (seven) days.       Steward DroneVeronica Karl Knarr Certified  Nurse-Midwife 01/06/2018 4:03 AM

## 2018-01-06 NOTE — Progress Notes (Signed)
Written and verbal d/c instructions given and understanding voiced. Pt reminded to drink a lot of water daily and avoid a lot of tea, juices, and sodas. Voices understanding. To return for decreased FM, vag bleeding like a period, regular ctxs, LOF.

## 2018-01-06 NOTE — MAU Note (Addendum)
Woke up Friday with lower abd pain. Worse with movement. Denies LOF or bleeding. Pt arrived by EMS to Rm #3. Ambulated to BR without difficulty

## 2018-01-06 NOTE — Discharge Instructions (Signed)

## 2018-01-11 ENCOUNTER — Other Ambulatory Visit (HOSPITAL_COMMUNITY)
Admission: RE | Admit: 2018-01-11 | Discharge: 2018-01-11 | Disposition: A | Discharge status: Home / Self Care | Payer: Medicaid Other | Source: Ambulatory Visit | Attending: Certified Nurse Midwife | Admitting: Certified Nurse Midwife

## 2018-01-11 ENCOUNTER — Ambulatory Visit (INDEPENDENT_AMBULATORY_CARE_PROVIDER_SITE_OTHER): Payer: Medicaid Other | Admitting: Certified Nurse Midwife

## 2018-01-11 ENCOUNTER — Encounter: Payer: Self-pay | Admitting: Certified Nurse Midwife

## 2018-01-11 VITALS — BP 118/78 | HR 101 | Temp 99.1°F | Wt 144.6 lb

## 2018-01-11 DIAGNOSIS — Z3480 Encounter for supervision of other normal pregnancy, unspecified trimester: Secondary | ICD-10-CM | POA: Insufficient documentation

## 2018-01-11 DIAGNOSIS — B373 Candidiasis of vulva and vagina: Secondary | ICD-10-CM | POA: Diagnosis not present

## 2018-01-11 DIAGNOSIS — Z3483 Encounter for supervision of other normal pregnancy, third trimester: Secondary | ICD-10-CM

## 2018-01-11 DIAGNOSIS — E559 Vitamin D deficiency, unspecified: Secondary | ICD-10-CM

## 2018-01-11 DIAGNOSIS — O26893 Other specified pregnancy related conditions, third trimester: Secondary | ICD-10-CM

## 2018-01-11 DIAGNOSIS — Z283 Underimmunization status: Secondary | ICD-10-CM

## 2018-01-11 DIAGNOSIS — O26899 Other specified pregnancy related conditions, unspecified trimester: Secondary | ICD-10-CM

## 2018-01-11 DIAGNOSIS — Z2839 Other underimmunization status: Secondary | ICD-10-CM

## 2018-01-11 DIAGNOSIS — O09899 Supervision of other high risk pregnancies, unspecified trimester: Secondary | ICD-10-CM

## 2018-01-11 DIAGNOSIS — B3731 Acute candidiasis of vulva and vagina: Secondary | ICD-10-CM

## 2018-01-11 DIAGNOSIS — R102 Pelvic and perineal pain: Secondary | ICD-10-CM

## 2018-01-11 DIAGNOSIS — O09893 Supervision of other high risk pregnancies, third trimester: Secondary | ICD-10-CM

## 2018-01-11 MED ORDER — COMFORT FIT MATERNITY SUPP MED MISC: 1.0000 | Freq: Every day | 0 refills | Status: DC

## 2018-01-11 MED ORDER — TERCONAZOLE 0.8 % VA CREA
1.0000 | TOPICAL_CREAM | Freq: Every day | VAGINAL | 0 refills | Status: DC
Start: 2018-01-11 — End: 2018-01-25

## 2018-01-11 MED ORDER — FLUCONAZOLE 150 MG PO TABS: 150.0000 mg | ORAL_TABLET | Freq: Once | ORAL | 0 refills | Status: AC

## 2018-01-11 NOTE — Progress Notes (Signed)
Desires maternity belt RX.

## 2018-01-11 NOTE — Progress Notes (Signed)
   PRENATAL VISIT NOTE  Subjective:  Allison Hamilton is a 22 y.o. G3P0020 at 2118w5d being seen today for ongoing prenatal care.  She is currently monitored for the following issues for this low-risk pregnancy and has Major depressive disorder, recurrent episode, moderate (HCC); Supervision of other normal pregnancy, antepartum; Family history of spina bifida; History of twin pregnancy in prior pregnancy; Vitamin D deficiency; Maternal varicella, non-immune; Vaginal yeast infection; and Blunt abdominal trauma on their problem list.  Patient reports no bleeding, no contractions, no cramping, no leaking and round ligament pain; was seen in MAU for this problem..  Contractions: Not present. Vag. Bleeding: None.  Movement: Present. Denies leaking of fluid.   The following portions of the patient's history were reviewed and updated as appropriate: allergies, current medications, past family history, past medical history, past social history, past surgical history and problem list. Problem list updated.  Objective:   Vitals:   01/11/18 1453  BP: 118/78  Pulse: (!) 101  Temp: 99.1 F (37.3 C)  Weight: 144 lb 9.6 oz (65.6 kg)    Fetal Status: Fetal Heart Rate (bpm): 135; doppler Fundal Height: 32 cm Movement: Present     General:  Alert, oriented and cooperative. Patient is in no acute distress.  Skin: Skin is warm and dry. No rash noted.   Cardiovascular: Normal heart rate noted  Respiratory: Normal respiratory effort, no problems with respiration noted  Abdomen: Soft, gravid, appropriate for gestational age.  Pain/Pressure: Absent     Pelvic: Cervical exam deferred        Extremities: Normal range of motion.  Edema: Trace  Mental Status: Normal mood and affect. Normal behavior. Normal judgment and thought content.   Assessment and Plan:  Pregnancy: G3P0020 at 6218w5d  1. Supervision of other normal pregnancy, antepartum      - Urine cytology ancillary only  2. Vitamin D deficiency  Taking weekly vitamin D  3. Maternal varicella, non-immune       4. Pain of round ligament during pregnancy      - Elastic Bandages & Supports (COMFORT FIT MATERNITY SUPP MED) MISC; 1 Device by Does not apply route daily.  Dispense: 1 each; Refill: 0  5. Yeast vaginitis      - Urine cytology ancillary only - fluconazole (DIFLUCAN) 150 MG tablet; Take 1 tablet (150 mg total) by mouth once for 1 dose.  Dispense: 1 tablet; Refill: 0 - terconazole (TERAZOL 3) 0.8 % vaginal cream; Place 1 applicator vaginally at bedtime.  Dispense: 20 g; Refill: 0  Preterm labor symptoms and general obstetric precautions including but not limited to vaginal bleeding, contractions, leaking of fluid and fetal movement were reviewed in detail with the patient. Please refer to After Visit Summary for other counseling recommendations.  Return in about 2 weeks (around 01/25/2018) for ROB.  Future Appointments  Date Time Provider Department Center  01/18/2018  1:00 PM [facility]-MFC US 3 [facility]-M[facility]-US    Roe Coombsachelle A Nabria Nevin, CNM

## 2018-01-12 LAB — URINE CYTOLOGY ANCILLARY ONLY
CHLAMYDIA, DNA PROBE: NEGATIVE
NEISSERIA GONORRHEA: NEGATIVE
Trichomonas: NEGATIVE

## 2018-01-15 LAB — URINE CYTOLOGY ANCILLARY ONLY
Bacterial vaginitis: NEGATIVE
Candida vaginitis: NEGATIVE

## 2018-01-18 ENCOUNTER — Encounter (HOSPITAL_COMMUNITY): Payer: Self-pay

## 2018-01-18 ENCOUNTER — Other Ambulatory Visit (HOSPITAL_COMMUNITY): Payer: Self-pay | Admitting: *Deleted

## 2018-01-18 ENCOUNTER — Ambulatory Visit (HOSPITAL_COMMUNITY)
Admission: RE | Admit: 2018-01-18 | Discharge: 2018-01-18 | Disposition: A | Discharge status: Home / Self Care | Payer: Medicaid Other | Source: Ambulatory Visit | Attending: Certified Nurse Midwife | Admitting: Certified Nurse Midwife

## 2018-01-18 ENCOUNTER — Other Ambulatory Visit (HOSPITAL_COMMUNITY): Payer: Self-pay | Admitting: Maternal and Fetal Medicine

## 2018-01-18 DIAGNOSIS — Z3A32 32 weeks gestation of pregnancy: Secondary | ICD-10-CM | POA: Insufficient documentation

## 2018-01-18 DIAGNOSIS — Z87898 Personal history of other specified conditions: Secondary | ICD-10-CM

## 2018-01-18 DIAGNOSIS — Z364 Encounter for antenatal screening for fetal growth retardation: Secondary | ICD-10-CM

## 2018-01-18 DIAGNOSIS — Z362 Encounter for other antenatal screening follow-up: Secondary | ICD-10-CM | POA: Insufficient documentation

## 2018-01-18 DIAGNOSIS — Z8279 Family history of other congenital malformations, deformations and chromosomal abnormalities: Secondary | ICD-10-CM

## 2018-01-23 ENCOUNTER — Encounter: Payer: Self-pay | Admitting: *Deleted

## 2018-01-25 ENCOUNTER — Inpatient Hospital Stay (HOSPITAL_COMMUNITY)
Admission: AD | Admit: 2018-01-25 | Discharge: 2018-01-25 | Disposition: A | Discharge status: Home / Self Care | Payer: Medicaid Other | Source: Ambulatory Visit | Attending: Obstetrics & Gynecology | Admitting: Obstetrics & Gynecology

## 2018-01-25 ENCOUNTER — Encounter: Payer: Self-pay | Admitting: Certified Nurse Midwife

## 2018-01-25 ENCOUNTER — Encounter (HOSPITAL_COMMUNITY): Payer: Self-pay | Admitting: *Deleted

## 2018-01-25 ENCOUNTER — Ambulatory Visit (INDEPENDENT_AMBULATORY_CARE_PROVIDER_SITE_OTHER): Payer: Medicaid Other | Admitting: Certified Nurse Midwife

## 2018-01-25 VITALS — BP 112/78 | HR 111 | Wt 148.0 lb

## 2018-01-25 DIAGNOSIS — O368390 Maternal care for abnormalities of the fetal heart rate or rhythm, unspecified trimester, not applicable or unspecified: Secondary | ICD-10-CM

## 2018-01-25 DIAGNOSIS — Z362 Encounter for other antenatal screening follow-up: Secondary | ICD-10-CM | POA: Diagnosis not present

## 2018-01-25 DIAGNOSIS — Z3689 Encounter for other specified antenatal screening: Secondary | ICD-10-CM | POA: Diagnosis not present

## 2018-01-25 DIAGNOSIS — O26893 Other specified pregnancy related conditions, third trimester: Secondary | ICD-10-CM | POA: Diagnosis not present

## 2018-01-25 DIAGNOSIS — R12 Heartburn: Secondary | ICD-10-CM | POA: Insufficient documentation

## 2018-01-25 DIAGNOSIS — Z3A33 33 weeks gestation of pregnancy: Secondary | ICD-10-CM | POA: Diagnosis not present

## 2018-01-25 DIAGNOSIS — Z87891 Personal history of nicotine dependence: Secondary | ICD-10-CM | POA: Diagnosis not present

## 2018-01-25 DIAGNOSIS — Z3483 Encounter for supervision of other normal pregnancy, third trimester: Secondary | ICD-10-CM

## 2018-01-25 DIAGNOSIS — Z79899 Other long term (current) drug therapy: Secondary | ICD-10-CM | POA: Insufficient documentation

## 2018-01-25 DIAGNOSIS — E559 Vitamin D deficiency, unspecified: Secondary | ICD-10-CM

## 2018-01-25 DIAGNOSIS — Z3480 Encounter for supervision of other normal pregnancy, unspecified trimester: Secondary | ICD-10-CM

## 2018-01-25 MED ORDER — RANITIDINE HCL 150 MG PO TABS: 150.0000 mg | ORAL_TABLET | Freq: Two times a day (BID) | ORAL | 0 refills | Status: DC

## 2018-01-25 NOTE — MAU Note (Signed)
Pt sent from office for baseline FHR in 115 range.

## 2018-01-25 NOTE — Patient Instructions (Signed)
AREA PEDIATRIC/FAMILY PRACTICE PHYSICIANS  [facility] [address], [address] [place], [place]  [address] Phone - [phone]   Fax - [phone]  [facility] [address] [place], [place] [address] Phone - [phone]   Fax - [phone]  [facility] [address] [place], [place]  [address] Phone - [phone]   Fax - [phone]  [facility] [address] [place], [place]  [address] Phone - [phone]   Fax - [phone]  [facility] [address] [place], [place]  [address] Phone - [phone]   Fax - [phone]  [facility] [address] [place], [place]  [address] Phone - [phone]   Fax - [phone]  [facility] [address] [place], [place]  [address] Phone - [phone]   Fax - [phone]  [facility] [address] [place], [place]  [address] Phone - [phone]   Fax - [phone]  [facility] [address] [place], [place]  [address] Phone - [phone]   Fax - [phone] [facility] [address] [place], [place]  [address] Phone - [phone]   Fax - [phone]  [facility] [address] [place], [place]  [address] Phone - [phone]   Fax - [phone]  [facility] [address] [place], [place]  [address] Phone - [phone]   Fax - [phone]  [facility] [address] [place], [place]  [address] Phone - [phone]   Fax - [phone]  [facility] [address] [place], [place]  [address] Phone - [phone]  [facility] [address] [place], [place]  [address] Phone - [phone]   Fax - [phone]  [facility] [address] [place], [place]  [address] Phone - [phone]   Fax - [phone]  [facility] [address] [place], [place]  [address] Phone - [phone]   Fax - [phone]  [facility] [address] [place], [place]  [address] Phone - [phone]   Fax - [phone]   [facility] [address] [place], [place]  [address] Phone - [phone]   Fax - [phone]  [facility] [address] [place], [place]  [address] Phone - [phone]   Fax - [phone]  [facility] [address] [place], [place]  [address] Phone - [phone]   Fax - [phone]  [facility] [address] [place], [place]  [address] Phone - [phone]   Fax - [phone]  [facility] [address] , [place]  [address] Phone - [phone]   Fax - [phone] [facility] [address] [place], [place]  [address] Phone - [phone]   Fax - [phone]   [facility] [address] [place], [place]  [address] Phone - [phone]   Fax - [phone]   [facility] [address] [place], [place]  [address] Phone - [phone]   Fax - [phone]  [facility] Charlene Flemming MD [address] [place] [address] Phone [phone]  Fax [phone]   

## 2018-01-25 NOTE — Progress Notes (Signed)
   PRENATAL VISIT NOTE  Subjective:  Allison Hamilton is a 22 y.o. G3P0020 at [redacted]w[redacted]d being seen today for ongoing prenatal care.  She is currently monitored for the following issues for this low-risk pregnancy and has Major depressive disorder, recurrent episode, moderate (HCC); Supervision of other normal pregnancy, antepartum; Family history of spina bifida; History of twin pregnancy in prior pregnancy; Vitamin D deficiency; Maternal varicella, non-immune; Vaginal yeast infection; and Blunt abdominal trauma on their problem list.  Patient reports no complaints.  Contractions: Not present. Vag. Bleeding: None.  Movement: Present. Denies leaking of fluid.   The following portions of the patient's history were reviewed and updated as appropriate: allergies, current medications, past family history, past medical history, past social history, past surgical history and problem list. Problem list updated.  Objective:   Vitals:   01/25/18 1550  BP: 112/78  Pulse: (!) 111  Weight: 148 lb (67.1 kg)    Fetal Status:   Fundal Height: 32 cm Movement: Present     General:  Alert, oriented and cooperative. Patient is in no acute distress.  Skin: Skin is warm and dry. No rash noted.   Cardiovascular: Normal heart rate noted  Respiratory: Normal respiratory effort, no problems with respiration noted  Abdomen: Soft, gravid, appropriate for gestational age.  Pain/Pressure: Present     Pelvic: Cervical exam deferred        Extremities: Normal range of motion.  Edema: None  Mental Status: Normal mood and affect. Normal behavior. Normal judgment and thought content.   Assessment and Plan:  Pregnancy: G3P0020 at [redacted]w[redacted]d  1. Supervision of other normal pregnancy, antepartum     Low fetal resting heart rate noted: low 110's no doppler.  NST done: fetal heart rate high 110's for baseline with accelerations.  Sent to MAU for evaluation.    2. Vitamin D deficiency     Taking weekly vitamin D.   Preterm  labor symptoms and general obstetric precautions including but not limited to vaginal bleeding, contractions, leaking of fluid and fetal movement were reviewed in detail with the patient. Please refer to After Visit Summary for other counseling recommendations.  Return in about 2 weeks (around 02/08/2018) for ROB, GBS.  Future Appointments  Date Time Provider Department Center  02/08/2018  2:30 PM [facility] [place] 1 [facility]-M[facility]US MFC-US    Roe Coombs, CNM

## 2018-01-25 NOTE — Progress Notes (Signed)
NST

## 2018-01-25 NOTE — MAU Provider Note (Signed)
Chief Complaint:  Non-stress Test   None     HPI: Allison Hamilton is a 22 y.o. G3P0020 at 78w5dwho presents to maternity admissions sent from the office for low fetal heart rate.  She was seen for routine OB visit today and had doppler FHR of 115 so was placed on NST.  On NST, FHR baseline was noted to be 115 with accelerations and she was sent to MAU for further evaluation. She denies any pain.  The baby is moving well.  She reports daily heartburn not resolved with Tums. There are no other associated symptoms. She has not tried any other treatments.  She reports good fetal movement, denies LOF, vaginal bleeding, vaginal itching/burning, urinary symptoms, h/a, dizziness, n/v, or fever/chills.    HPI  Past Medical History: Past Medical History:  Diagnosis Date  . Headache     Past obstetric history: OB History  Gravida Para Term Preterm AB Living  3       2 0  SAB TAB Ectopic Multiple Live Births  2 0          # Outcome Date GA Lbr Len/2nd Weight Sex Delivery Anes PTL Lv  3 Current           2 SAB 08/2015             Complications: Pregnancy, twins  1 SAB 04/2015            Past Surgical History: Past Surgical History:  Procedure Laterality Date  . NO PAST SURGERIES      Family History: History reviewed. No pertinent family history.  Social History: Social History   Tobacco Use  . Smoking status: Former Smoker    Types: Cigarettes    Last attempt to quit: 07/2017    Years since quitting: 0.5  . Smokeless tobacco: Never Used  Substance Use Topics  . Alcohol use: No    Frequency: Never  . Drug use: No    Allergies: No Known Allergies  Meds:  Medications Prior to Admission  Medication Sig Dispense Refill Last Dose  . acetaminophen (TYLENOL) 500 MG tablet Take 500 mg by mouth every 6 (six) hours as needed.   Taking  . diphenhydramine-acetaminophen (TYLENOL PM) 25-500 MG TABS tablet Take 2 tablets by mouth at bedtime as needed (sleep).   Taking  .  Doxylamine-Pyridoxine (DICLEGIS) 10-10 MG TBEC Take 1 tablet with breakfast and lunch.  Take 2 tablets at bedtime. 100 tablet 4 Taking  . Elastic Bandages & Supports (COMFORT FIT MATERNITY SUPP MED) MISC 1 Device by Does not apply route daily. 1 each 0 Taking  . ondansetron (ZOFRAN) 8 MG tablet Take 1 tablet (8 mg total) by mouth every 8 (eight) hours as needed for nausea or vomiting. 40 tablet 2 Taking  . polyethylene glycol powder (GLYCOLAX/MIRALAX) powder Take 17 g by mouth daily as needed for moderate constipation. 500 g 2 Taking  . Prenatal-Fe Fum-Methf-FA w/o A (VITAFOL-NANO) 18-0.6-0.4 MG TABS Take 1 tablet by mouth daily. 30 tablet 12 Taking  . promethazine (PHENERGAN) 25 MG tablet Take 1 tablet (25 mg total) by mouth every 6 (six) hours as needed for nausea or vomiting. 30 tablet 3 Taking  . Vitamin D, Ergocalciferol, (DRISDOL) 50000 units CAPS capsule Take 1 capsule (50,000 Units total) by mouth every 7 (seven) days. 30 capsule 2 Taking    ROS:  Review of Systems  Constitutional: Negative for chills, fatigue and fever.  Eyes: Negative for visual disturbance.  Respiratory: Negative for  shortness of breath.   Cardiovascular: Negative for chest pain.  Gastrointestinal: Negative for abdominal pain, nausea and vomiting.  Genitourinary: Negative for difficulty urinating, dysuria, flank pain, pelvic pain, vaginal bleeding, vaginal discharge and vaginal pain.  Neurological: Negative for dizziness and headaches.  Psychiatric/Behavioral: Negative.      I have reviewed patient's Past Medical Hx, Surgical Hx, Family Hx, Social Hx, medications and allergies.   Physical Exam   Patient Vitals for the past 24 hrs:  BP Temp Temp src Pulse Resp SpO2 Height Weight  01/25/18 1732 (!) 103/56 (!) 97.3 F (36.3 C) Oral 81 15 100 %  (1.575 m) 149 lb (67.6 kg)   Constitutional: Well-developed, well-nourished female in no acute distress.  Cardiovascular: normal rate Respiratory: normal  effort GI: Abd soft, non-tender, gravid appropriate for gestational age.  MS: Extremities nontender, no edema, normal ROM Neurologic: Alert and oriented x 4.  GU: Neg CVAT.  PELVIC EXAM: Cervix pink, visually closed, without lesion, scant white creamy discharge, vaginal walls and external genitalia normal Bimanual exam: Cervix 0/long/high, firm, anterior, neg CMT, uterus nontender, nonenlarged, adnexa without tenderness, enlargement, or mass     FHT:  Baseline 115 , moderate variability, accelerations present, no decelerations Contractions: None on toco or to palpation   Labs: No results found for this or any previous visit (from the past 24 hour(s)). --/--/A POS (03/19 2343)  Imaging:    MAU Course/MDM: I have ordered labs and reviewed results.  NST reviewed and reactive with baseline 115 Consult Dr Debroah Loop with NST review D/C home, f/u as scheduled Zantac 150 mg BID for heartburn Pt discharge with strict return precautions.   Assessment: 1. NST (non-stress test) reactive   2. Heartburn during pregnancy in third trimester     Plan: Discharge home Labor precautions and fetal kick counts  Follow-up Information    [facility] Follow up.   Why:  As scheduled, return to MAU for emergencies Contact information: [address] [place] [address] [phone]         Allergies as of 01/25/2018   No Known Allergies     Medication List    TAKE these medications   acetaminophen 500 MG tablet Commonly known as:  TYLENOL Take 500 mg by mouth every 6 (six) hours as needed.   COMFORT FIT MATERNITY SUPP MED Misc 1 Device by Does not apply route daily.   diphenhydramine-acetaminophen 25-500 MG Tabs tablet Commonly known as:  TYLENOL PM Take 2 tablets by mouth at bedtime as needed (sleep).   Doxylamine-Pyridoxine 10-10 MG Tbec Commonly known as:  DICLEGIS Take 1 tablet with breakfast and lunch.  Take 2 tablets at bedtime.    ondansetron 8 MG tablet Commonly known as:  ZOFRAN Take 1 tablet (8 mg total) by mouth every 8 (eight) hours as needed for nausea or vomiting.   polyethylene glycol powder powder Commonly known as:  GLYCOLAX/MIRALAX Take 17 g by mouth daily as needed for moderate constipation.   promethazine 25 MG tablet Commonly known as:  PHENERGAN Take 1 tablet (25 mg total) by mouth every 6 (six) hours as needed for nausea or vomiting.   ranitidine 150 MG tablet Commonly known as:  ZANTAC Take 1 tablet (150 mg total) by mouth 2 (two) times daily.   VITAFOL-NANO 18-0.6-0.4 MG Tabs Take 1 tablet by mouth daily.   Vitamin D (Ergocalciferol) 50000 units Caps capsule Commonly known as:  DRISDOL Take 1 capsule (50,000 Units total) by mouth  every 7 (seven) days.       Sharen Counter Certified Nurse-Midwife 01/25/2018 7:17 PM

## 2018-01-25 NOTE — MAU Note (Signed)
Urine sent to lab 

## 2018-02-08 ENCOUNTER — Ambulatory Visit (HOSPITAL_COMMUNITY)
Admission: RE | Admit: 2018-02-08 | Discharge: 2018-02-08 | Disposition: A | Discharge status: Home / Self Care | Payer: Medicaid Other | Source: Ambulatory Visit | Attending: Certified Nurse Midwife | Admitting: Certified Nurse Midwife

## 2018-02-08 ENCOUNTER — Encounter: Payer: Self-pay | Admitting: Certified Nurse Midwife

## 2018-02-09 ENCOUNTER — Other Ambulatory Visit (HOSPITAL_COMMUNITY): Payer: Self-pay | Admitting: Obstetrics and Gynecology

## 2018-02-09 ENCOUNTER — Ambulatory Visit (HOSPITAL_COMMUNITY)
Admission: RE | Admit: 2018-02-09 | Discharge: 2018-02-09 | Disposition: A | Discharge status: Home / Self Care | Payer: Medicaid Other | Source: Ambulatory Visit | Attending: Certified Nurse Midwife | Admitting: Certified Nurse Midwife

## 2018-02-09 ENCOUNTER — Encounter (HOSPITAL_COMMUNITY): Payer: Self-pay

## 2018-02-09 DIAGNOSIS — O365930 Maternal care for other known or suspected poor fetal growth, third trimester, not applicable or unspecified: Secondary | ICD-10-CM

## 2018-02-09 DIAGNOSIS — Z8279 Family history of other congenital malformations, deformations and chromosomal abnormalities: Secondary | ICD-10-CM | POA: Insufficient documentation

## 2018-02-09 DIAGNOSIS — Z362 Encounter for other antenatal screening follow-up: Secondary | ICD-10-CM

## 2018-02-09 DIAGNOSIS — Z3A35 35 weeks gestation of pregnancy: Secondary | ICD-10-CM | POA: Insufficient documentation

## 2018-02-09 DIAGNOSIS — Z87898 Personal history of other specified conditions: Secondary | ICD-10-CM | POA: Insufficient documentation

## 2018-02-13 ENCOUNTER — Ambulatory Visit (INDEPENDENT_AMBULATORY_CARE_PROVIDER_SITE_OTHER): Payer: Medicaid Other | Admitting: Certified Nurse Midwife

## 2018-02-13 ENCOUNTER — Other Ambulatory Visit (HOSPITAL_COMMUNITY)
Admission: RE | Admit: 2018-02-13 | Discharge: 2018-02-13 | Disposition: A | Discharge status: Home / Self Care | Payer: Medicaid Other | Source: Ambulatory Visit | Attending: Certified Nurse Midwife | Admitting: Certified Nurse Midwife

## 2018-02-13 ENCOUNTER — Encounter: Payer: Self-pay | Admitting: Certified Nurse Midwife

## 2018-02-13 VITALS — BP 101/69 | HR 108 | Wt 149.2 lb

## 2018-02-13 DIAGNOSIS — E559 Vitamin D deficiency, unspecified: Secondary | ICD-10-CM

## 2018-02-13 DIAGNOSIS — Z3480 Encounter for supervision of other normal pregnancy, unspecified trimester: Secondary | ICD-10-CM | POA: Diagnosis not present

## 2018-02-13 LAB — OB RESULTS CONSOLE GC/CHLAMYDIA: Gonorrhea: NEGATIVE

## 2018-02-13 NOTE — Progress Notes (Signed)
   PRENATAL VISIT NOTE  Subjective:  Allison Hamilton is a 22 y.o. G3P0020 at [redacted]w[redacted]d being seen today for ongoing prenatal care.  She is currently monitored for the following issues for this low-risk pregnancy and has Major depressive disorder, recurrent episode, moderate (HCC); Supervision of other normal pregnancy, antepartum; Family history of spina bifida; History of twin pregnancy in prior pregnancy; Vitamin D deficiency; Maternal varicella, non-immune; Vaginal yeast infection; Blunt abdominal trauma; [redacted] weeks gestation of pregnancy; H/O poor fetal growth; Encounter for other antenatal screening follow-up; Family history of congenital anomalies; and Maternal care for other known or suspected poor fetal growth, third trimester, not applicable or unspecified on their problem list.  Patient reports no bleeding, no contractions, no cramping and no leaking.  Contractions: Irritability. Vag. Bleeding: None.  Movement: Present. Denies leaking of fluid.   The following portions of the patient's history were reviewed and updated as appropriate: allergies, current medications, past family history, past medical history, past social history, past surgical history and problem list. Problem list updated.  Objective:   Vitals:   02/13/18 1503  BP: 101/69  Pulse: (!) 108  Weight: 149 lb 3.2 oz (67.7 kg)    Fetal Status: Fetal Heart Rate (bpm): 125; doppler Fundal Height: 33 cm Movement: Present  Presentation: Vertex  General:  Alert, oriented and cooperative. Patient is in no acute distress.  Skin: Skin is warm and dry. No rash noted.   Cardiovascular: Normal heart rate noted  Respiratory: Normal respiratory effort, no problems with respiration noted  Abdomen: Soft, gravid, appropriate for gestational age.  Pain/Pressure: Present     Pelvic: Cervical exam performed Dilation: Closed Effacement (%): Thick Station: -3, posterior  Extremities: Normal range of motion.  Edema: Trace  Mental Status: Normal  mood and affect. Normal behavior. Normal judgment and thought content.   Assessment and Plan:  Pregnancy: G3P0020 at [redacted]w[redacted]d  1. Supervision of other normal pregnancy, antepartum     Doing well.  Cultures today.   2. Vitamin D deficiency     Taking weekly vitamin D  Preterm labor symptoms and general obstetric precautions including but not limited to vaginal bleeding, contractions, leaking of fluid and fetal movement were reviewed in detail with the patient. Please refer to After Visit Summary for other counseling recommendations.  Return in about 1 week (around 02/20/2018) for ROB.  No future appointments.  Roe Coombs, CNM

## 2018-02-14 LAB — GC/CHLAMYDIA PROBE AMP (~~LOC~~) NOT AT ARMC
Chlamydia: NEGATIVE
Neisseria Gonorrhea: NEGATIVE

## 2018-02-15 ENCOUNTER — Other Ambulatory Visit: Payer: Self-pay | Admitting: Certified Nurse Midwife

## 2018-02-15 DIAGNOSIS — Z3480 Encounter for supervision of other normal pregnancy, unspecified trimester: Secondary | ICD-10-CM

## 2018-02-15 LAB — STREP GP B NAA: Strep Gp B NAA: NEGATIVE

## 2018-02-20 ENCOUNTER — Encounter: Payer: Self-pay | Admitting: Certified Nurse Midwife

## 2018-02-20 ENCOUNTER — Ambulatory Visit (INDEPENDENT_AMBULATORY_CARE_PROVIDER_SITE_OTHER): Payer: Medicaid Other | Admitting: Certified Nurse Midwife

## 2018-02-20 VITALS — BP 122/76 | HR 97 | Wt 156.4 lb

## 2018-02-20 DIAGNOSIS — E559 Vitamin D deficiency, unspecified: Secondary | ICD-10-CM

## 2018-02-20 DIAGNOSIS — Z3480 Encounter for supervision of other normal pregnancy, unspecified trimester: Secondary | ICD-10-CM

## 2018-02-20 NOTE — Patient Instructions (Addendum)
[facility] Before your baby arrives it is important to:  Have all of the supplies that you will need to care for your baby.  Know where to go if there is an emergency.  Discuss the baby's arrival with other family members.  What supplies will I need?  It is recommended that you have the following supplies: Large Items  Crib.  Crib mattress.  Rear-facing infant car seat. If possible, have a trained professional check to make sure that it is installed correctly.  Feeding  6-8 bottles that are 4-5 oz in size.  6-8 nipples.  Bottle brush.  Sterilizer, or a large pan or kettle with a lid.  A way to boil and cool water.  If you will be breastfeeding: ? Breast pump. ? Nipple cream. ? Nursing bra. ? Breast pads. ? Breast shields.  If you will be formula feeding: ? Formula. ? Measuring cups. ? Measuring spoons.  Bathing  Mild baby soap and baby shampoo.  Petroleum jelly.  Soft cloth towel and washcloth.  Hooded towel.  Cotton balls.  Bath basin.  Other Supplies  Rectal thermometer.  Bulb syringe.  Baby wipes or washcloths for diaper changes.  Diaper bag.  Changing pad.  Clothing, including one-piece outfits and pajamas.  Baby nail clippers.  Receiving blankets.  Mattress pad and sheets for the crib.  Night-light for the baby's room.  Baby monitor.  2 or 3 pacifiers.  Either 24-36 cloth diapers and waterproof diaper covers or a box of disposable diapers. You may need to use as many as 10-12 diapers per day.  How do I prepare for an emergency? Prepare for an emergency by:  Knowing how to get to the nearest hospital.  Listing the phone numbers of your baby's health care providers near your home phone and in your cell phone.  How do I prepare my family?  Decide how to handle visitors.  If you have other children: ? Talk with them about the baby coming home. Ask them how they feel about it. ? Read a book together about  being a new big brother or sister. ? Find ways to let them help you prepare for the new baby. ? Have someone ready to care for them while you are in the hospital. This information is not intended to replace advice given to you by your health care provider. Make sure you discuss any questions you have with your health care provider. Document Released: 08/18/2008 Document Revised: 02/11/2016 Document Reviewed: 08/13/2014 Elsevier Interactive Patient Education  2018 Elsevier Inc.  Pain Relief During Labor and Delivery Many things can cause pain during labor and delivery, including:  Pressure on bones and ligaments due to the baby moving through the pelvis.  Stretching of tissues due to the baby moving through the birth canal.  Muscle tension due to anxiety or nervousness.  The uterus tightening (contracting) and relaxing to help move the baby.  There are many ways to deal with the pain of labor and delivery. They include:  Taking prenatal classes. Taking these classes helps you know what to expect during your baby's birth. What you learn will increase your confidence and decrease your anxiety.  Practicing relaxation techniques or doing relaxing activities, such as: ? Focused breathing. ? Meditation. ? Visualization. ? Aroma therapy. ? Listening to your favorite music. ? Hypnosis.  Taking a warm shower or bath (hydrotherapy). This may: ? Provide comfort and relaxation. ? Lessen your perception of pain. ? Decrease the amount of  pain medicine needed. ? Decrease the length of labor.  Getting a massage or counterpressure on your back.  Applying warm packs or ice packs.  Changing positions often, moving around, or using a birthing ball.  Getting: ? Pain medicine through an IV or injection into a muscle. ? Pain medicine inserted into your spinal column. ? Injections of sterile water just under the skin on your lower back (intradermal injections). ? Laughing gas (nitrous  oxide).  Discuss your pain control options with your health care provider during your prenatal visits. Explore the options offered by your hospital or birth center. What kinds of medicine are available? There are two kinds of medicines that can be used to relieve pain during labor and delivery:  Analgesics. These medicines decrease pain without causing you to lose feeling or the ability to move your muscles.  Anesthetics. These medicines block feeling in the body and can decrease your ability to move freely.  Both of these kinds of medicine can cause minor side effects, such as nausea, trouble concentrating, and sleepiness. They can also decrease the baby's heart rate before birth and affect the baby's breathing rate after birth. For this reason, health care providers are careful about when and how much medicine is given. What are specific medicines and procedures that provide pain relief? Local Anesthetics Local anesthetics are used to numb a small area of the body. They may be used along with another kind of anesthetic or used to numb the nerves of the vagina, cervix, and perineum during the second stage of labor. General Anesthetics General anesthetics cause you to lose consciousness so you do not feel pain. They are usually only used for an emergency cesarean delivery. General anesthetics are given through an IV tube and a mask. Pudendal Block A pudendal block is a form of local anesthetic. It may be used to relieve the pain associated with pushing or stretching of the perineum at the time of delivery or to further numb the perineum. A pudendal block is done by injecting numbing medicine through the vaginal wall into a nerve in the pelvis. Epidural Analgesia Epidural analgesia is given through a flexible IV catheter that is inserted into the lower back. Numbing medicine is delivered continuously to the area near your spinal column nerves (epidural space). After having this type of analgesia,  you may be able to move your legs but you most likely will not be able to walk. Depending on the amount of medicine given, you may lose all feeling in the lower half of your body, or you may retain some level of sensation, including the urge to push. Epidural analgesia can be used to provide pain relief for a vaginal birth. Spinal Block A spinal block is similar to epidural analgesia, but the medicine is injected into the spinal fluid instead of the epidural space. A spinal block is only given once. It starts to relieve pain quickly, but the pain relief lasts only 1-6 hours. Spinal blocks can be used for cesarean deliveries. Combined Spinal-Epidural (CSE) Block A CSE block combines the effects of a spinal block and epidural analgesia. The spinal block works quickly to block all pain. The epidural analgesia provides continuous pain relief, even after the effects of the spinal block have worn off. This information is not intended to replace advice given to you by your health care provider. Make sure you discuss any questions you have with your health care provider. Document Released: 12/22/2008 Document Revised: 02/12/2016 Document Reviewed: 01/27/2016 Elsevier Interactive  Patient Education  [employer].  Third Trimester of Pregnancy The third trimester is from week 29 through week 42, months 7 through 9. This trimester is when your unborn baby (fetus) is growing very fast. At the end of the ninth month, the unborn baby is about 20 inches in length. It weighs about 6-10 pounds. Follow these instructions at home:  Avoid all smoking, herbs, and alcohol. Avoid drugs not approved by your doctor.  Do not use any tobacco products, including cigarettes, chewing tobacco, and electronic cigarettes. If you need help quitting, ask your doctor. You may get counseling or other support to help you quit.  Only take medicine as told by your doctor. Some medicines are safe and some are not during  pregnancy.  Exercise only as told by your doctor. Stop exercising if you start having cramps.  Eat regular, healthy meals.  Wear a good support bra if your breasts are tender.  Do not use hot tubs, steam rooms, or saunas.  Wear your seat belt when driving.  Avoid raw meat, uncooked cheese, and liter boxes and soil used by cats.  Take your prenatal vitamins.  Take 1500-2000 milligrams of calcium daily starting at the 20th week of pregnancy until you deliver your baby.  Try taking medicine that helps you poop (stool softener) as needed, and if your doctor approves. Eat more fiber by eating fresh fruit, vegetables, and whole grains. Drink enough fluids to keep your pee (urine) clear or pale yellow.  Take warm water baths (sitz baths) to soothe pain or discomfort caused by hemorrhoids. Use hemorrhoid cream if your doctor approves.  If you have puffy, bulging veins (varicose veins), wear support hose. Raise (elevate) your feet for 15 minutes, 3-4 times a day. Limit salt in your diet.  Avoid heavy lifting, wear low heels, and sit up straight.  Rest with your legs raised if you have leg cramps or low back pain.  Visit your dentist if you have not gone during your pregnancy. Use a soft toothbrush to brush your teeth. Be gentle when you floss.  You can have sex (intercourse) unless your doctor tells you not to.  Do not travel far distances unless you must. Only do so with your doctor's approval.  Take prenatal classes.  Practice driving to the hospital.  Pack your hospital bag.  Prepare the baby's room.  Go to your doctor visits. Get help if:  You are not sure if you are in labor or if your water has broken.  You are dizzy.  You have mild cramps or pressure in your lower belly (abdominal).  You have a nagging pain in your belly area.  You continue to feel sick to your stomach (nauseous), throw up (vomit), or have watery poop (diarrhea).  You have bad smelling fluid  coming from your vagina.  You have pain with peeing (urination). Get help right away if:  You have a fever.  You are leaking fluid from your vagina.  You are spotting or bleeding from your vagina.  You have severe belly cramping or pain.  You lose or gain weight rapidly.  You have trouble catching your breath and have chest pain.  You notice sudden or extreme puffiness (swelling) of your face, hands, ankles, feet, or legs.  You have not felt the baby move in over an hour.  You have severe headaches that do not go away with medicine.  You have vision changes. This information is not intended to replace advice given  to you by your health care provider. Make sure you discuss any questions you have with your health care provider. Document Released: 11/30/2009 Document Revised: 02/11/2016 Document Reviewed: 11/06/2012 Elsevier Interactive Patient Education  2017 Elsevier Inc.  Vaginal Delivery Vaginal delivery means that you will give birth by pushing your baby out of your birth canal (vagina). A team of health care providers will help you before, during, and after vaginal delivery. Birth experiences are unique for every woman and every pregnancy, and birth experiences vary depending on where you choose to give birth. What should I do to prepare for my baby's birth? Before your baby is born, it is important to talk with your health care provider about:  Your labor and delivery preferences. These may include: ? Medicines that you may be given. ? How you will manage your pain. This might include non-medical pain relief techniques or injectable pain relief such as epidural analgesia. ? How you and your baby will be monitored during labor and delivery. ? Who may be in the labor and delivery room with you. ? Your feelings about surgical delivery of your baby (cesarean delivery, or C-section) if this becomes necessary. ? Your feelings about receiving donated blood through an IV tube  (blood transfusion) if this becomes necessary.  Whether you are able: ? To take pictures or videos of the birth. ? To eat during labor and delivery. ? To move around, walk, or change positions during labor and delivery.  What to expect after your baby is born, such as: ? Whether delayed umbilical cord clamping and cutting is offered. ? Who will care for your baby right after birth. ? Medicines or tests that may be recommended for your baby. ? Whether breastfeeding is supported in your hospital or birth center. ? How long you will be in the hospital or birth center.  How any medical conditions you have may affect your baby or your labor and delivery experience.  To prepare for your baby's birth, you should also:  Attend all of your health care visits before delivery (prenatal visits) as recommended by your health care provider. This is important.  Prepare your home for your baby's arrival. Make sure that you have: ? Diapers. ? Baby clothing. ? Feeding equipment. ? Safe sleeping arrangements for you and your baby.  Install a car seat in your vehicle. Have your car seat checked by a certified car seat installer to make sure that it is installed safely.  Think about who will help you with your new baby at home for at least the first several weeks after delivery.  What can I expect when I arrive at the birth center or hospital? Once you are in labor and have been admitted into the hospital or birth center, your health care provider may:  Review your pregnancy history and any concerns you have.  Insert an IV tube into one of your veins. This is used to give you fluids and medicines.  Check your blood pressure, pulse, temperature, and heart rate (vital signs).  Check whether your bag of water (amniotic sac) has broken (ruptured).  Talk with you about your birth plan and discuss pain control options.  Monitoring Your health care provider may monitor your contractions (uterine  monitoring) and your baby's heart rate (fetal monitoring). You may need to be monitored:  Often, but not continuously (intermittently).  All the time or for long periods at a time (continuously). Continuous monitoring may be needed if: ? You are taking certain  medicines, such as medicine to relieve pain or make your contractions stronger. ? You have pregnancy or labor complications.  Monitoring may be done by:  Placing a special stethoscope or a handheld monitoring device on your abdomen to check your baby's heartbeat, and feeling your abdomen for contractions. This method of monitoring does not continuously record your baby's heartbeat or your contractions.  Placing monitors on your abdomen (external monitors) to record your baby's heartbeat and the frequency and length of contractions. You may not have to wear external monitors all the time.  Placing monitors inside of your uterus (internal monitors) to record your baby's heartbeat and the frequency, length, and strength of your contractions. ? Your health care provider may use internal monitors if he or she needs more information about the strength of your contractions or your baby's heart rate. ? Internal monitors are put in place by passing a thin, flexible wire through your vagina and into your uterus. Depending on the type of monitor, it may remain in your uterus or on your baby's head until birth. ? Your health care provider will discuss the benefits and risks of internal monitoring with you and will ask for your permission before inserting the monitors.  Telemetry. This is a type of continuous monitoring that can be done with external or internal monitors. Instead of having to stay in bed, you are able to move around during telemetry. Ask your health care provider if telemetry is an option for you.  Physical exam Your health care provider may perform a physical exam. This may include:  Checking whether your baby is  positioned: ? With the head toward your vagina (head-down). This is most common. ? With the head toward the top of your uterus (head-up or breech). If your baby is in a breech position, your health care provider may try to turn your baby to a head-down position so you can deliver vaginally. If it does not seem that your baby can be born vaginally, your provider may recommend surgery to deliver your baby. In rare cases, you may be able to deliver vaginally if your baby is head-up (breech delivery). ? Lying sideways (transverse). Babies that are lying sideways cannot be delivered vaginally.  Checking your cervix to determine: ? Whether it is thinning out (effacing). ? Whether it is opening up (dilating). ? How low your baby has moved into your birth canal.  What are the three stages of labor and delivery?  Normal labor and delivery is divided into the following three stages: Stage 1  Stage 1 is the longest stage of labor, and it can last for hours or days. Stage 1 includes: ? Early labor. This is when contractions may be irregular, or regular and mild. Generally, early labor contractions are more than 10 minutes apart. ? Active labor. This is when contractions get longer, more regular, more frequent, and more intense. ? The transition phase. This is when contractions happen very close together, are very intense, and may last longer than during any other part of labor.  Contractions generally feel mild, infrequent, and irregular at first. They get stronger, more frequent (about every 2-3 minutes), and more regular as you progress from early labor through active labor and transition.  Many women progress through stage 1 naturally, but you may need help to continue making progress. If this happens, your health care provider may talk with you about: ? Rupturing your amniotic sac if it has not ruptured yet. ? Giving you medicine to help  make your contractions stronger and more frequent.  Stage 1  ends when your cervix is completely dilated to 4 inches (10 cm) and completely effaced. This happens at the end of the transition phase. Stage 2  Once your cervix is completely effaced and dilated to 4 inches (10 cm), you may start to feel an urge to push. It is common for the body to naturally take a rest before feeling the urge to push, especially if you received an epidural or certain other pain medicines. This rest period may last for up to 1-2 hours, depending on your unique labor experience.  During stage 2, contractions are generally less painful, because pushing helps relieve contraction pain. Instead of contraction pain, you may feel stretching and burning pain, especially when the widest part of your baby's head passes through the vaginal opening (crowning).  Your health care provider will closely monitor your pushing progress and your baby's progress through the vagina during stage 2.  Your health care provider may massage the area of skin between your vaginal opening and anus (perineum) or apply warm compresses to your perineum. This helps it stretch as the baby's head starts to crown, which can help prevent perineal tearing. ? In some cases, an incision may be made in your perineum (episiotomy) to allow the baby to pass through the vaginal opening. An episiotomy helps to make the opening of the vagina larger to allow more room for the baby to fit through.  It is very important to breathe and focus so your health care provider can control the delivery of your baby's head. Your health care provider may have you decrease the intensity of your pushing, to help prevent perineal tearing.  After delivery of your baby's head, the shoulders and the rest of the body generally deliver very quickly and without difficulty.  Once your baby is delivered, the umbilical cord may be cut right away, or this may be delayed for 1-2 minutes, depending on your baby's health. This may vary among health care  providers, hospitals, and birth centers.  If you and your baby are healthy enough, your baby may be placed on your chest or abdomen to help maintain the baby's temperature and to help you bond with each other. Some mothers and babies start breastfeeding at this time. Your health care team will dry your baby and help keep your baby warm during this time.  Your baby may need immediate care if he or she: ? Showed signs of distress during labor. ? Has a medical condition. ? Was born too early (prematurely). ? Had a bowel movement before birth (meconium). ? Shows signs of difficulty transitioning from being inside the uterus to being outside of the uterus. If you are planning to breastfeed, your health care team will help you begin a feeding. Stage 3  The third stage of labor starts immediately after the birth of your baby and ends after you deliver the placenta. The placenta is an organ that develops during pregnancy to provide oxygen and nutrients to your baby in the womb.  Delivering the placenta may require some pushing, and you may have mild contractions. Breastfeeding can stimulate contractions to help you deliver the placenta.  After the placenta is delivered, your uterus should tighten (contract) and become firm. This helps to stop bleeding in your uterus. To help your uterus contract and to control bleeding, your health care provider may: ? Give you medicine by injection, through an IV tube, by mouth, or through your  rectum (rectally). ? Massage your abdomen or perform a vaginal exam to remove any blood clots that are left in your uterus. ? Empty your bladder by placing a thin, flexible tube (catheter) into your bladder. ? Encourage you to breastfeed your baby. After labor is over, you and your baby will be monitored closely to ensure that you are both healthy until you are ready to go home. Your health care team will teach you how to care for yourself and your baby. This information is  not intended to replace advice given to you by your health care provider. Make sure you discuss any questions you have with your health care provider. Document Released: 06/14/2008 Document Revised: 03/25/2016 Document Reviewed: 09/20/2015 Elsevier Interactive Patient Education  2018 [employer]   AREA PEDIATRIC/FAMILY PRACTICE PHYSICIANS  [facility] [address] [place], [place]  [address] Phone - [phone]   Fax - [phone]  [facility] [address] [place], [place] [address] Phone - [phone]   Fax - [phone]  [facility] [address] [place], [place]  [address] Phone - [phone]   Fax - [phone]  [facility] [address] [place], [place]  [address] Phone - [phone]   Fax - [phone]  [facility] [address] [place], [place]  [address] Phone - [phone]   Fax - [phone]  [facility] [address], [address] [place], [place]  [address] Phone - [phone]   Fax - [phone]  [facility] [address] [place], [place]  [address] Phone - [phone]   Fax - [phone]  [facility] [address] [place], [place]  [address] Phone - [phone]   Fax - [phone]  [facility] [address] [place], [place]  [address] Phone - [phone]   Fax - [phone] [facility] [address] [place], [place]  [address] Phone - [phone]   Fax - [phone]  [facility] [address] [place], [place]  [address] Phone - [phone]   Fax - [phone]  [facility] [address], [address] [place], [place]  [address] Phone - [phone]   Fax - [phone]  [facility] [address], [address] [place], [place]  [address] Phone - [phone]   Fax - [phone]  [facility] [address], [address] [place], [place]  [address] Phone - [phone]  [facility] [address] [place], [place]  [address] Phone - [phone]   Fax - [phone]  [facility] [address], [address] [place], [place]  [address] Phone - [phone]   Fax - [phone]  [facility] [address] [place], [place]  [address] Phone - [phone]   Fax - [phone]  [facility] [address] [place], [place]  [address] Phone - [phone]   Fax - [phone]   [facility] [address] [place], [place]  [address] Phone - [phone]   Fax - [phone]  [facility] [address] [place], [place]  [address] Phone - [phone]   Fax - [phone]  DAVID RUBIN [address] [place], [place]  [address] Phone - [phone]   Fax - [phone]  [facility] [address], [phone]  [place], [place]  [address] Phone - [phone]   Fax - [phone]  [facility] [address] [place], [place]  [address] Phone - [phone]   Fax - [phone] Gerarda Fraction [address], [place]  [address] Phone - [phone]   Fax - [phone]  [facility] [address] [place], [place]  [address] Phone - [phone]   Fax - [phone]  [facility] [place] [address] [place], [place]  [address] Phone - [phone]   Fax - [phone]  [facility] [place] Wyvonne Lenz MD [address] [place] [address] Phone [phone]  Fax [phone]   Contraception Choices Contraception, also called birth control, refers to methods or devices that prevent pregnancy. Hormonal methods Contraceptive implant A contraceptive  implant is a thin, plastic tube that contains a hormone. It is inserted into the upper part of the arm. It can remain in place for up to 3 years. Progestin-only injections Progestin-only injections are injections of progestin, a synthetic form of the hormone progesterone. They are given every 3 months by a health care provider. Birth control pills Birth control pills are pills that contain hormones that prevent pregnancy. They must be taken once a day, preferably at the same time each day. Birth control patch The birth control patch contains hormones that prevent pregnancy. It is placed on the skin and must be changed once a week for three weeks and removed on the fourth week. A prescription is needed to use this method of contraception. Vaginal ring A vaginal ring contains hormones that prevent pregnancy. It is placed in the vagina for three weeks and removed on the fourth week. After that, the process is repeated with a new ring. A prescription is needed to use this method of contraception. Emergency contraceptive Emergency contraceptives prevent pregnancy after unprotected sex. They come in pill form and can be taken up to 5 days after sex. They work best the sooner they are taken after having sex. Most emergency contraceptives are available without a prescription. This method should not be used as your only form of birth control. Barrier methods Female condom A female condom is a thin sheath that is worn over the penis during sex. Condoms keep sperm from going inside a woman's body. They can be used with a spermicide to increase their effectiveness. They should be disposed after a single use. Female condom A female condom is a soft, loose-fitting sheath that is put into the vagina before sex. The condom keeps sperm from going inside a woman's body. They should be disposed after a single use. Diaphragm A diaphragm is a soft, dome-shaped barrier. It is inserted into the vagina before sex, along with a  spermicide. The diaphragm blocks sperm from entering the uterus, and the spermicide kills sperm. A diaphragm should be left in the vagina for 6-8 hours after sex and removed within 24 hours. A diaphragm is prescribed and fitted by a health care provider. A diaphragm should be replaced every 1-2 years, after giving birth, after gaining more than 15 lb (6.8 kg), and after pelvic surgery. Cervical cap A cervical cap is a round, soft latex or plastic cup that fits over the cervix. It is inserted into the vagina before sex, along with spermicide. It blocks sperm from entering the uterus. The cap should be left in place for 6-8 hours after sex and removed within 48 hours. A cervical cap must be prescribed and fitted  by a health care provider. It should be replaced every 2 years. Sponge A sponge is a soft, circular piece of polyurethane foam with spermicide on it. The sponge helps block sperm from entering the uterus, and the spermicide kills sperm. To use it, you make it wet and then insert it into the vagina. It should be inserted before sex, left in for at least 6 hours after sex, and removed and thrown away within 30 hours. Spermicides Spermicides are chemicals that kill or block sperm from entering the cervix and uterus. They can come as a cream, jelly, suppository, foam, or tablet. A spermicide should be inserted into the vagina with an applicator at least 10-15 minutes before sex to allow time for it to work. The process must be repeated every time you have sex. Spermicides do not require a prescription. Intrauterine contraception Intrauterine device (IUD) An IUD is a T-shaped device that is put in a woman's uterus. There are two types:  Hormone IUD.This type contains progestin, a synthetic form of the hormone progesterone. This type can stay in place for 3-5 years.  Copper IUD.This type is wrapped in copper wire. It can stay in place for 10 years.  Permanent methods of contraception Female tubal  ligation In this method, a woman's fallopian tubes are sealed, tied, or blocked during surgery to prevent eggs from traveling to the uterus. Hysteroscopic sterilization In this method, a small, flexible insert is placed into each fallopian tube. The inserts cause scar tissue to form in the fallopian tubes and block them, so sperm cannot reach an egg. The procedure takes about 3 months to be effective. Another form of birth control must be used during those 3 months. Female sterilization This is a procedure to tie off the tubes that carry sperm (vasectomy). After the procedure, the man can still ejaculate fluid (semen). Natural planning methods Natural family planning In this method, a couple does not have sex on days when the woman could become pregnant. Calendar method This means keeping track of the length of each menstrual cycle, identifying the days when pregnancy can happen, and not having sex on those days. Ovulation method In this method, a couple avoids sex during ovulation. Symptothermal method This method involves not having sex during ovulation. The woman typically checks for ovulation by watching changes in her temperature and in the consistency of cervical mucus. Post-ovulation method In this method, a couple waits to have sex until after ovulation. Summary  Contraception, also called birth control, means methods or devices that prevent pregnancy.  Hormonal methods of contraception include implants, injections, pills, patches, vaginal rings, and emergency contraceptives.  Barrier methods of contraception can include female condoms, female condoms, diaphragms, cervical caps, sponges, and spermicides.  There are two types of IUDs (intrauterine devices). An IUD can be put in a woman's uterus to prevent pregnancy for 3-5 years.  Permanent sterilization can be done through a procedure for males, females, or both.  Natural family planning methods involve not having sex on days when  the woman could become pregnant. This information is not intended to replace advice given to you by your health care provider. Make sure you discuss any questions you have with your health care provider. Document Released: 09/05/2005 Document Revised: 10/08/2016 Document Reviewed: 10/08/2016 Elsevier Interactive Patient Education  2018 [employer].

## 2018-02-20 NOTE — Progress Notes (Signed)
   PRENATAL VISIT NOTE  Subjective:  Allison Hamilton is a 22 y.o. G3P0020 at 3867w3d being seen today for ongoing prenatal care.  She is currently monitored for the following issues for this low-risk pregnancy and has Major depressive disorder, recurrent episode, moderate (HCC); Supervision of other normal pregnancy, antepartum; Family history of spina bifida; History of twin pregnancy in prior pregnancy; Vitamin D deficiency; Maternal varicella, non-immune; Vaginal yeast infection; Blunt abdominal trauma; [redacted] weeks gestation of pregnancy; H/O poor fetal growth; Encounter for other antenatal screening follow-up; Family history of congenital anomalies; and Maternal care for other known or suspected poor fetal growth, third trimester, not applicable or unspecified on their problem list.  Patient reports no complaints.  Contractions: Not present. Vag. Bleeding: None.  Movement: Present. Denies leaking of fluid.   The following portions of the patient's history were reviewed and updated as appropriate: allergies, current medications, past family history, past medical history, past social history, past surgical history and problem list. Problem list updated.  Objective:   Vitals:   02/20/18 1516  BP: 122/76  Pulse: 97  Weight: 156 lb 6.4 oz (70.9 kg)    Fetal Status: Fetal Heart Rate (bpm): 125; doppler Fundal Height: 35 cm Movement: Present     General:  Alert, oriented and cooperative. Patient is in no acute distress.  Skin: Skin is warm and dry. No rash noted.   Cardiovascular: Normal heart rate noted  Respiratory: Normal respiratory effort, no problems with respiration noted  Abdomen: Soft, gravid, appropriate for gestational age.  Pain/Pressure: Present     Pelvic: Cervical exam deferred        Extremities: Normal range of motion.  Edema: None  Mental Status: Normal mood and affect. Normal behavior. Normal judgment and thought content.   Assessment and Plan:  Pregnancy: G3P0020 at  467w3d  1. Supervision of other normal pregnancy, antepartum      Doing well.    2. Vitamin D deficiency     Taking weekly vitamin D  Preterm labor symptoms and general obstetric precautions including but not limited to vaginal bleeding, contractions, leaking of fluid and fetal movement were reviewed in detail with the patient. Please refer to After Visit Summary for other counseling recommendations.  Return in about 1 week (around 02/27/2018) for ROB.  No future appointments.  Roe Coombsachelle A Nazim Kadlec, CNM

## 2018-02-27 ENCOUNTER — Ambulatory Visit (INDEPENDENT_AMBULATORY_CARE_PROVIDER_SITE_OTHER): Payer: Medicaid Other | Admitting: Certified Nurse Midwife

## 2018-02-27 VITALS — BP 113/79 | HR 114 | Wt 153.8 lb

## 2018-02-27 DIAGNOSIS — O26893 Other specified pregnancy related conditions, third trimester: Secondary | ICD-10-CM

## 2018-02-27 DIAGNOSIS — R12 Heartburn: Secondary | ICD-10-CM

## 2018-02-27 DIAGNOSIS — Z3483 Encounter for supervision of other normal pregnancy, third trimester: Secondary | ICD-10-CM

## 2018-02-27 DIAGNOSIS — E559 Vitamin D deficiency, unspecified: Secondary | ICD-10-CM

## 2018-02-27 DIAGNOSIS — Z3480 Encounter for supervision of other normal pregnancy, unspecified trimester: Secondary | ICD-10-CM

## 2018-02-27 MED ORDER — OMEPRAZOLE 20 MG PO CPDR: 20.0000 mg | DELAYED_RELEASE_CAPSULE | Freq: Two times a day (BID) | ORAL | 5 refills | Status: DC

## 2018-02-27 NOTE — Progress Notes (Signed)
Patient reports good fetal movement with irregular contractions.  

## 2018-02-27 NOTE — Progress Notes (Signed)
   PRENATAL VISIT NOTE  Subjective:  Allison Hamilton is a 22 y.o. G3P0020 at 7152w3d being seen today for ongoing prenatal care.  She is currently monitored for the following issues for this low-risk pregnancy and has Major depressive disorder, recurrent episode, moderate (HCC); Supervision of other normal pregnancy, antepartum; Family history of spina bifida; History of twin pregnancy in prior pregnancy; Vitamin D deficiency; Maternal varicella, non-immune; Vaginal yeast infection; Blunt abdominal trauma; H/O poor fetal growth; Encounter for other antenatal screening follow-up; Family history of congenital anomalies; and Maternal care for other known or suspected poor fetal growth, third trimester, not applicable or unspecified on their problem list.  Patient reports heartburn, no bleeding, no leaking and occasional contractions.  Contractions: Irregular. Vag. Bleeding: None.  Movement: Present. Denies leaking of fluid.   The following portions of the patient's history were reviewed and updated as appropriate: allergies, current medications, past family history, past medical history, past social history, past surgical history and problem list. Problem list updated.  Objective:   Vitals:   02/27/18 1541  BP: 113/79  Pulse: (!) 114  Weight: 153 lb 12.8 oz (69.8 kg)    Fetal Status: Fetal Heart Rate (bpm): 138; doppler +FM noted Fundal Height: 34 cm Movement: Present     General:  Alert, oriented and cooperative. Patient is in no acute distress.  Skin: Skin is warm and dry. No rash noted.   Cardiovascular: Normal heart rate noted  Respiratory: Normal respiratory effort, no problems with respiration noted  Abdomen: Soft, gravid, appropriate for gestational age.  Pain/Pressure: Absent     Pelvic: Cervical exam deferred        Extremities: Normal range of motion.  Edema: None  Mental Status: Normal mood and affect. Normal behavior. Normal judgment and thought content.   Assessment and Plan:    Pregnancy: G3P0020 at 1652w3d  1. Supervision of other normal pregnancy, antepartum     Doing well.  Growth US at 35 wks normal.  Reports increased heart burn: Prilosec ordered.   2. Vitamin D deficiency     Taking weekly vitamin D  Term labor symptoms and general obstetric precautions including but not limited to vaginal bleeding, contractions, leaking of fluid and fetal movement were reviewed in detail with the patient. Please refer to After Visit Summary for other counseling recommendations.  Return in about 1 week (around 03/06/2018) for ROB.  Future Appointments  Date Time Provider Department Center  03/06/2018  4:00 PM Roe Coombsenney, Gid Schoffstall A, CNM [facility] None    Roe Coombsachelle A Agam Davenport, CNM

## 2018-03-06 ENCOUNTER — Other Ambulatory Visit (HOSPITAL_COMMUNITY)
Admission: RE | Admit: 2018-03-06 | Discharge: 2018-03-06 | Disposition: A | Discharge status: Home / Self Care | Payer: Medicaid Other | Source: Ambulatory Visit | Attending: Certified Nurse Midwife | Admitting: Certified Nurse Midwife

## 2018-03-06 ENCOUNTER — Ambulatory Visit (INDEPENDENT_AMBULATORY_CARE_PROVIDER_SITE_OTHER): Payer: Medicaid Other | Admitting: Certified Nurse Midwife

## 2018-03-06 ENCOUNTER — Encounter: Payer: Self-pay | Admitting: Certified Nurse Midwife

## 2018-03-06 VITALS — BP 120/83 | HR 102 | Wt 158.0 lb

## 2018-03-06 DIAGNOSIS — Z3483 Encounter for supervision of other normal pregnancy, third trimester: Secondary | ICD-10-CM | POA: Diagnosis not present

## 2018-03-06 DIAGNOSIS — E559 Vitamin D deficiency, unspecified: Secondary | ICD-10-CM

## 2018-03-06 DIAGNOSIS — Z3480 Encounter for supervision of other normal pregnancy, unspecified trimester: Secondary | ICD-10-CM

## 2018-03-06 NOTE — Progress Notes (Signed)
   PRENATAL VISIT NOTE  Subjective:  Allison Hamilton is a 22 y.o. G3P0020 at 3236w3d being seen today for ongoing prenatal care.  She is currently monitored for the following issues for this low-risk pregnancy and has Major depressive disorder, recurrent episode, moderate (HCC); Supervision of other normal pregnancy, antepartum; Family history of spina bifida; History of twin pregnancy in prior pregnancy; Vitamin D deficiency; Maternal varicella, non-immune; Vaginal yeast infection; Blunt abdominal trauma; H/O poor fetal growth; Encounter for other antenatal screening follow-up; Family history of congenital anomalies; and Maternal care for other known or suspected poor fetal growth, third trimester, not applicable or unspecified on their problem list.  Patient reports no bleeding, no leaking, occasional contractions and vaginal irritation.  Contractions: Irregular. Vag. Bleeding: None.  Movement: Present. Denies leaking of fluid.   The following portions of the patient's history were reviewed and updated as appropriate: allergies, current medications, past family history, past medical history, past social history, past surgical history and problem list. Problem list updated.  Objective:   Vitals:   03/06/18 1624  BP: 120/83  Pulse: (!) 102  Weight: 158 lb (71.7 kg)    Fetal Status: Fetal Heart Rate (bpm): 142; doppler Fundal Height: 39 cm Movement: Present  Presentation: Vertex  General:  Alert, oriented and cooperative. Patient is in no acute distress.  Skin: Skin is warm and dry. No rash noted.   Cardiovascular: Normal heart rate noted  Respiratory: Normal respiratory effort, no problems with respiration noted  Abdomen: Soft, gravid, appropriate for gestational age.  Pain/Pressure: Present     Pelvic: Cervical exam performed Dilation: 1.5 Effacement (%): Thick Station: -3  Extremities: Normal range of motion.     Mental Status: Normal mood and affect. Normal behavior. Normal judgment and  thought content.   Assessment and Plan:  Pregnancy: G3P0020 at 3336w3d  1. Supervision of other normal pregnancy, antepartum     Doing well.  OTC medications for constipation discussed.  - Cervicovaginal ancillary only, for increased vaginal discharge  2. Vitamin D deficiency     Taking weekly vitamin D.   Term labor symptoms and general obstetric precautions including but not limited to vaginal bleeding, contractions, leaking of fluid and fetal movement were reviewed in detail with the patient. Please refer to After Visit Summary for other counseling recommendations.  Return in about 1 week (around 03/13/2018) for ROB, NST.  Future Appointments  Date Time Provider Department Center  03/07/2018 12:15 PM Agbuya, Ines BloomerPerry Scott, DO [facility]    Roe Coombsachelle A Zahava Quant, CNM

## 2018-03-07 ENCOUNTER — Telehealth (HOSPITAL_COMMUNITY): Payer: Self-pay | Admitting: *Deleted

## 2018-03-07 ENCOUNTER — Ambulatory Visit (INDEPENDENT_AMBULATORY_CARE_PROVIDER_SITE_OTHER): Payer: Medicaid Other | Admitting: Pediatrics

## 2018-03-07 DIAGNOSIS — Z7681 Expectant parent(s) prebirth pediatrician visit: Secondary | ICD-10-CM

## 2018-03-07 NOTE — Telephone Encounter (Signed)
Preadmission screen  

## 2018-03-07 NOTE — Progress Notes (Signed)
Prenatal counseling for impending newborn done--1st child, currently 39wks, no complications during pregnancy, prenatal at 7-8wks 56Z76.81

## 2018-03-08 LAB — CERVICOVAGINAL ANCILLARY ONLY
BACTERIAL VAGINITIS: NEGATIVE
Candida vaginitis: NEGATIVE

## 2018-03-14 ENCOUNTER — Inpatient Hospital Stay (HOSPITAL_COMMUNITY)
Admission: AD | Admit: 2018-03-14 | Discharge: 2018-03-14 | Disposition: A | Discharge status: Home / Self Care | Payer: Medicaid Other | Source: Ambulatory Visit | Attending: Obstetrics and Gynecology | Admitting: Obstetrics and Gynecology

## 2018-03-14 ENCOUNTER — Ambulatory Visit (INDEPENDENT_AMBULATORY_CARE_PROVIDER_SITE_OTHER): Payer: Medicaid Other | Admitting: Certified Nurse Midwife

## 2018-03-14 ENCOUNTER — Encounter: Payer: Self-pay | Admitting: Certified Nurse Midwife

## 2018-03-14 ENCOUNTER — Encounter (HOSPITAL_COMMUNITY): Payer: Self-pay

## 2018-03-14 ENCOUNTER — Inpatient Hospital Stay (HOSPITAL_COMMUNITY)
Admission: AD | Admit: 2018-03-14 | Discharge: 2018-03-17 | DRG: 807 | Disposition: A | Discharge status: Home / Self Care | Payer: Medicaid Other | Attending: Obstetrics & Gynecology | Admitting: Obstetrics & Gynecology

## 2018-03-14 ENCOUNTER — Other Ambulatory Visit: Payer: Self-pay

## 2018-03-14 VITALS — BP 136/84 | HR 87 | Wt 166.4 lb

## 2018-03-14 DIAGNOSIS — Z3480 Encounter for supervision of other normal pregnancy, unspecified trimester: Secondary | ICD-10-CM

## 2018-03-14 DIAGNOSIS — O480 Post-term pregnancy: Secondary | ICD-10-CM | POA: Diagnosis present

## 2018-03-14 DIAGNOSIS — O479 False labor, unspecified: Secondary | ICD-10-CM

## 2018-03-14 DIAGNOSIS — Z3483 Encounter for supervision of other normal pregnancy, third trimester: Secondary | ICD-10-CM

## 2018-03-14 DIAGNOSIS — Z3A40 40 weeks gestation of pregnancy: Secondary | ICD-10-CM | POA: Diagnosis not present

## 2018-03-14 DIAGNOSIS — Z87891 Personal history of nicotine dependence: Secondary | ICD-10-CM

## 2018-03-14 MED ORDER — ACETAMINOPHEN 325 MG PO TABS
650.0000 mg | ORAL_TABLET | ORAL | Status: DC | PRN
  Administered 2018-03-15: 650 mg via ORAL
  Filled 2018-03-14: qty 2

## 2018-03-14 MED ORDER — OXYTOCIN 40 UNITS IN LACTATED RINGERS INFUSION - SIMPLE MED
2.5000 [IU]/h | INTRAVENOUS | Status: DC
  Filled 2018-03-14: qty 1000

## 2018-03-14 MED ORDER — MISOPROSTOL 25 MCG QUARTER TABLET
25.0000 ug | ORAL_TABLET | ORAL | Status: DC | PRN
  Filled 2018-03-14: qty 1

## 2018-03-14 MED ORDER — ONDANSETRON HCL 4 MG/2ML IJ SOLN
4.0000 mg | Freq: Four times a day (QID) | INTRAMUSCULAR | Status: DC | PRN
  Administered 2018-03-15: 4 mg via INTRAVENOUS
  Filled 2018-03-14: qty 2

## 2018-03-14 MED ORDER — LIDOCAINE HCL (PF) 1 % IJ SOLN
30.0000 mL | INTRAMUSCULAR | Status: DC | PRN
  Filled 2018-03-14: qty 30

## 2018-03-14 MED ORDER — OXYCODONE-ACETAMINOPHEN 5-325 MG PO TABS: 1.0000 | ORAL_TABLET | ORAL | Status: DC | PRN

## 2018-03-14 MED ORDER — OXYTOCIN BOLUS FROM INFUSION
500.0000 mL | Freq: Once | INTRAVENOUS | Status: AC
  Administered 2018-03-15: 500 mL via INTRAVENOUS

## 2018-03-14 MED ORDER — TERBUTALINE SULFATE 1 MG/ML IJ SOLN
0.2500 mg | Freq: Once | INTRAMUSCULAR | Status: DC | PRN
  Filled 2018-03-14: qty 1

## 2018-03-14 MED ORDER — SOD CITRATE-CITRIC ACID 500-334 MG/5ML PO SOLN
30.0000 mL | ORAL | Status: DC | PRN
  Administered 2018-03-15: 30 mL via ORAL
  Filled 2018-03-14: qty 15

## 2018-03-14 MED ORDER — LACTATED RINGERS IV SOLN
500.0000 mL | INTRAVENOUS | Status: DC | PRN
  Administered 2018-03-14: 1000 mL via INTRAVENOUS

## 2018-03-14 MED ORDER — FLEET ENEMA 7-19 GM/118ML RE ENEM: 1.0000 | ENEMA | RECTAL | Status: DC | PRN

## 2018-03-14 MED ORDER — OXYCODONE-ACETAMINOPHEN 5-325 MG PO TABS: 2.0000 | ORAL_TABLET | ORAL | Status: DC | PRN

## 2018-03-14 MED ORDER — FENTANYL CITRATE (PF) 100 MCG/2ML IJ SOLN: 100.0000 ug | INTRAMUSCULAR | Status: DC | PRN

## 2018-03-14 MED ORDER — LACTATED RINGERS IV SOLN
INTRAVENOUS | Status: DC
  Administered 2018-03-15: 01:00:00 via INTRAVENOUS

## 2018-03-14 NOTE — MAU Note (Signed)
Urine in lab 

## 2018-03-14 NOTE — Discharge Instructions (Signed)
Braxton Hicks Contractions °Contractions of the uterus can occur throughout pregnancy, but they are not always a sign that you are in labor. You may have practice contractions called Braxton Hicks contractions. These false labor contractions are sometimes confused with true labor. °What are Braxton Hicks contractions? °Braxton Hicks contractions are tightening movements that occur in the muscles of the uterus before labor. Unlike true labor contractions, these contractions do not result in opening (dilation) and thinning of the cervix. Toward the end of pregnancy (32-34 weeks), Braxton Hicks contractions can happen more often and may become stronger. These contractions are sometimes difficult to tell apart from true labor because they can be very uncomfortable. You should not feel embarrassed if you go to the hospital with false labor. °Sometimes, the only way to tell if you are in true labor is for your health care provider to look for changes in the cervix. The health care provider will do a physical exam and may monitor your contractions. If you are not in true labor, the exam should show that your cervix is not dilating and your water has not broken. °If there are other health problems associated with your pregnancy, it is completely safe for you to be sent home with false labor. You may continue to have Braxton Hicks contractions until you go into true labor. °How to tell the difference between true labor and false labor °True labor °· Contractions last 30-70 seconds. °· Contractions become very regular. °· Discomfort is usually felt in the top of the uterus, and it spreads to the lower abdomen and low back. °· Contractions do not go away with walking. °· Contractions usually become more intense and increase in frequency. °· The cervix dilates and gets thinner. °False labor °· Contractions are usually shorter and not as strong as true labor contractions. °· Contractions are usually irregular. °· Contractions  are often felt in the front of the lower abdomen and in the groin. °· Contractions may go away when you walk around or change positions while lying down. °· Contractions get weaker and are shorter-lasting as time goes on. °· The cervix usually does not dilate or become thin. °Follow these instructions at home: °· Take over-the-counter and prescription medicines only as told by your health care provider. °· Keep up with your usual exercises and follow other instructions from your health care provider. °· Eat and drink lightly if you think you are going into labor. °· If Braxton Hicks contractions are making you uncomfortable: °? Change your position from lying down or resting to walking, or change from walking to resting. °? Sit and rest in a tub of warm water. °? Drink enough fluid to keep your urine pale yellow. Dehydration may cause these contractions. °? Do slow and deep breathing several times an hour. °· Keep all follow-up prenatal visits as told by your health care provider. This is important. °Contact a health care provider if: °· You have a fever. °· You have continuous pain in your abdomen. °Get help right away if: °· Your contractions become stronger, more regular, and closer together. °· You have fluid leaking or gushing from your vagina. °· You pass blood-tinged mucus (bloody show). °· You have bleeding from your vagina. °· You have low back pain that you never had before. °· You feel your baby’s head pushing down and causing pelvic pressure. °· Your baby is not moving inside you as much as it used to. °Summary °· Contractions that occur before labor are called Braxton   Hicks contractions, false labor, or practice contractions. °· Braxton Hicks contractions are usually shorter, weaker, farther apart, and less regular than true labor contractions. True labor contractions usually become progressively stronger and regular and they become more frequent. °· Manage discomfort from Braxton Hicks contractions by  changing position, resting in a warm bath, drinking plenty of water, or practicing deep breathing. °This information is not intended to replace advice given to you by your health care provider. Make sure you discuss any questions you have with your health care provider. °Document Released: 01/19/2017 Document Revised: 01/19/2017 Document Reviewed: 01/19/2017 °Elsevier Interactive Patient Education © 2018 [employer] ° °
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
## 2018-03-14 NOTE — MAU Note (Signed)
Reports CTX back to back since leaving at 1800.  No LOF/VB.  + FM.  Also having vomiting

## 2018-03-14 NOTE — MAU Note (Signed)
Pt states that she started having ctx last night and have continued into today. She states that they are 5 minutes apart.   Reports good fetal movement.   Denies vaginal bleeding or LOF.
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
## 2018-03-14 NOTE — MAU Note (Signed)
I have communicated with Caryl AdaJazma Phelps, DO and reviewed vital signs:  Vitals:   03/14/18 1701 03/14/18 1742  BP: 130/78 137/85  Pulse: 97 (!) 101  Resp: 20   Temp: 98.2 F (36.8 C)   SpO2: 100%     Vaginal exam:  Dilation: 2 Effacement (%): 60 Cervical Position: Middle Station: -3 Presentation: Vertex Exam by:: Zenia ResidesNikki Zariah Cavendish, RN ,   Also reviewed contraction pattern and that non-stress test is reactive.  It has been documented that patient is contracting every 3-4 minutes with cervical dilation of 2 cm not indicating active labor.  Patient denies any other complaints.  Based on this report provider has given order for discharge.  A discharge order and diagnosis entered by a provider.   Labor discharge instructions reviewed with patient.

## 2018-03-15 ENCOUNTER — Inpatient Hospital Stay (HOSPITAL_COMMUNITY): Payer: Medicaid Other | Admitting: Anesthesiology

## 2018-03-15 ENCOUNTER — Encounter (HOSPITAL_COMMUNITY): Payer: Self-pay

## 2018-03-15 ENCOUNTER — Other Ambulatory Visit: Payer: Self-pay

## 2018-03-15 DIAGNOSIS — Z3A40 40 weeks gestation of pregnancy: Secondary | ICD-10-CM

## 2018-03-15 LAB — CBC
HCT: 34.5 % — ABNORMAL LOW (ref 36.0–46.0)
HEMOGLOBIN: 11.4 g/dL — AB (ref 12.0–15.0)
MCH: 29.2 pg (ref 26.0–34.0)
MCHC: 33 g/dL (ref 30.0–36.0)
MCV: 88.2 fL (ref 78.0–100.0)
Platelets: 186 10*3/uL (ref 150–400)
RBC: 3.91 MIL/uL (ref 3.87–5.11)
RDW: 14.1 % (ref 11.5–15.5)
WBC: 13.6 10*3/uL — AB (ref 4.0–10.5)

## 2018-03-15 LAB — RPR: RPR: NONREACTIVE

## 2018-03-15 LAB — TYPE AND SCREEN
ABO/RH(D): A POS
Antibody Screen: NEGATIVE

## 2018-03-15 MED ORDER — ONDANSETRON HCL 4 MG PO TABS: 4.0000 mg | ORAL_TABLET | ORAL | Status: DC | PRN

## 2018-03-15 MED ORDER — DIPHENHYDRAMINE HCL 25 MG PO CAPS: 25.0000 mg | ORAL_CAPSULE | Freq: Four times a day (QID) | ORAL | Status: DC | PRN

## 2018-03-15 MED ORDER — PHENYLEPHRINE 40 MCG/ML (10ML) SYRINGE FOR IV PUSH (FOR BLOOD PRESSURE SUPPORT)
80.0000 ug | PREFILLED_SYRINGE | INTRAVENOUS | Status: DC | PRN
  Filled 2018-03-15: qty 10
  Filled 2018-03-15: qty 5

## 2018-03-15 MED ORDER — TETANUS-DIPHTH-ACELL PERTUSSIS 5-2.5-18.5 LF-MCG/0.5 IM SUSP
0.5000 mL | Freq: Once | INTRAMUSCULAR | Status: AC
  Administered 2018-03-16: 0.5 mL via INTRAMUSCULAR
  Filled 2018-03-15: qty 0.5

## 2018-03-15 MED ORDER — DIPHENHYDRAMINE HCL 50 MG/ML IJ SOLN: 12.5000 mg | INTRAMUSCULAR | Status: DC | PRN

## 2018-03-15 MED ORDER — SENNOSIDES-DOCUSATE SODIUM 8.6-50 MG PO TABS
2.0000 | ORAL_TABLET | ORAL | Status: DC
  Administered 2018-03-16 (×2): 2 via ORAL
  Filled 2018-03-15 (×2): qty 2

## 2018-03-15 MED ORDER — WITCH HAZEL-GLYCERIN EX PADS
1.0000 "application " | MEDICATED_PAD | CUTANEOUS | Status: DC | PRN
  Administered 2018-03-17: 1 via TOPICAL

## 2018-03-15 MED ORDER — FAMOTIDINE IN NACL 20-0.9 MG/50ML-% IV SOLN
20.0000 mg | Freq: Once | INTRAVENOUS | Status: AC
  Administered 2018-03-15: 20 mg via INTRAVENOUS
  Filled 2018-03-15: qty 50

## 2018-03-15 MED ORDER — SIMETHICONE 80 MG PO CHEW: 80.0000 mg | CHEWABLE_TABLET | ORAL | Status: DC | PRN

## 2018-03-15 MED ORDER — IBUPROFEN 600 MG PO TABS
600.0000 mg | ORAL_TABLET | Freq: Four times a day (QID) | ORAL | Status: DC
  Administered 2018-03-15 – 2018-03-17 (×9): 600 mg via ORAL
  Filled 2018-03-15 (×9): qty 1

## 2018-03-15 MED ORDER — PHENYLEPHRINE 40 MCG/ML (10ML) SYRINGE FOR IV PUSH (FOR BLOOD PRESSURE SUPPORT)
80.0000 ug | PREFILLED_SYRINGE | INTRAVENOUS | Status: DC | PRN
  Administered 2018-03-15 (×2): 80 ug via INTRAVENOUS
  Filled 2018-03-15: qty 5

## 2018-03-15 MED ORDER — FENTANYL 2.5 MCG/ML BUPIVACAINE 1/10 % EPIDURAL INFUSION (WH - ANES)
14.0000 mL/h | INTRAMUSCULAR | Status: DC | PRN
  Administered 2018-03-15: 14 mL/h via EPIDURAL
  Filled 2018-03-15: qty 100

## 2018-03-15 MED ORDER — EPHEDRINE 5 MG/ML INJ
10.0000 mg | INTRAVENOUS | Status: DC | PRN
  Filled 2018-03-15: qty 2

## 2018-03-15 MED ORDER — LACTATED RINGERS IV SOLN: 500.0000 mL | Freq: Once | INTRAVENOUS | Status: DC

## 2018-03-15 MED ORDER — COCONUT OIL OIL
1.0000 "application " | TOPICAL_OIL | Status: DC | PRN
  Administered 2018-03-17: 1 via TOPICAL
  Filled 2018-03-15: qty 120

## 2018-03-15 MED ORDER — FENTANYL 2.5 MCG/ML BUPIVACAINE 1/10 % EPIDURAL INFUSION (WH - ANES): 14.0000 mL/h | INTRAMUSCULAR | Status: DC | PRN

## 2018-03-15 MED ORDER — BENZOCAINE-MENTHOL 20-0.5 % EX AERO
1.0000 "application " | INHALATION_SPRAY | CUTANEOUS | Status: DC | PRN
  Administered 2018-03-15: 1 via TOPICAL
  Filled 2018-03-15: qty 56

## 2018-03-15 MED ORDER — FAMOTIDINE IN NACL 20-0.9 MG/50ML-% IV SOLN: 20.0000 mg | Freq: Two times a day (BID) | INTRAVENOUS | Status: DC

## 2018-03-15 MED ORDER — ACETAMINOPHEN 325 MG PO TABS
650.0000 mg | ORAL_TABLET | ORAL | Status: DC | PRN
Start: 2018-03-15 — End: 2018-03-17
  Administered 2018-03-16 – 2018-03-17 (×3): 650 mg via ORAL
  Filled 2018-03-15 (×3): qty 2

## 2018-03-15 MED ORDER — ZOLPIDEM TARTRATE 5 MG PO TABS: 5.0000 mg | ORAL_TABLET | Freq: Every evening | ORAL | Status: DC | PRN

## 2018-03-15 MED ORDER — LIDOCAINE HCL (PF) 1 % IJ SOLN
INTRAMUSCULAR | Status: DC | PRN
  Administered 2018-03-15 (×2): 4 mL via EPIDURAL

## 2018-03-15 MED ORDER — DIBUCAINE 1 % RE OINT: 1.0000 "application " | TOPICAL_OINTMENT | RECTAL | Status: DC | PRN

## 2018-03-15 MED ORDER — PRENATAL MULTIVITAMIN CH
1.0000 | ORAL_TABLET | Freq: Every day | ORAL | Status: DC
  Administered 2018-03-16 – 2018-03-17 (×2): 1 via ORAL
  Filled 2018-03-15 (×2): qty 1

## 2018-03-15 MED ORDER — ONDANSETRON HCL 4 MG/2ML IJ SOLN: 4.0000 mg | INTRAMUSCULAR | Status: DC | PRN

## 2018-03-15 NOTE — Anesthesia Preprocedure Evaluation (Signed)
Anesthesia Evaluation  Patient identified by MRN, date of birth, ID band Patient awake    Reviewed: Allergy & Precautions, Patient's Chart, lab work & pertinent test results  Airway Mallampati: II  TM Distance: >3 FB Neck ROM: Full    Dental no notable dental hx.    Pulmonary neg pulmonary ROS, former smoker,    Pulmonary exam normal breath sounds clear to auscultation       Cardiovascular negative cardio ROS Normal cardiovascular exam Rhythm:Regular Rate:Normal     Neuro/Psych  Headaches, PSYCHIATRIC DISORDERS Depression    GI/Hepatic negative GI ROS, Neg liver ROS,   Endo/Other  negative endocrine ROS  Renal/GU negative Renal ROS     Musculoskeletal negative musculoskeletal ROS (+)   Abdominal   Peds  Hematology negative hematology ROS (+)   Anesthesia Other Findings   Reproductive/Obstetrics (+) Pregnancy                             Anesthesia Physical Anesthesia Plan  ASA: II  Anesthesia Plan: Epidural   Post-op Pain Management:    Induction:   PONV Risk Score and Plan:   Airway Management Planned:   Additional Equipment:   Intra-op Plan:   Post-operative Plan:   Informed Consent: I have reviewed the patients History and Physical, chart, labs and discussed the procedure including the risks, benefits and alternatives for the proposed anesthesia with the patient or authorized representative who has indicated his/her understanding and acceptance.     Plan Discussed with:   Anesthesia Plan Comments:         Anesthesia Quick Evaluation

## 2018-03-15 NOTE — Lactation Note (Signed)
This note was copied from a baby's chart. Lactation Consultation Note  Patient Name: Allison Hamilton VHQIO'NToday's Date: 03/15/2018 Reason for consult: Initial assessment;Primapara;1st time breastfeeding;Term  2512 hours old FT female who is still being exclusively BF by his mother, she's a P1. She's planning on supplementing with formula at some point though, that was feeding choice upon admission. [facility] [place] She doesn't have a pump at home, [facility] pump [facility] the hospital; mom agreed. Pump instructions, cleaning and storage were reviewed as well as milk storage guidelines.  Baby was already nursing when entering the room, but mom had baby covered with blankets, he was falling asleep. Advised mom to do posterior feedings STS to try to keep baby awake. LC had to reposition baby to correct the latch, it was shallow, but after repositioning, baby had more depth, but no swallows were heard at this time. Discussed with mom tips for a deep latch. LC also changed baby's diaper and recorded a wet and a stool on Flowsheets. Mom will burp baby before putting him to the other breast, she wanted to have her dinner first.  Encouraged mom to feed STS baby 8-12 times/24 hours or sooner if feeding cues are present. If baby is not cueing in a 3 hour period she'll wake him up to feed. BF brochure, BF resources and feeding diary were reviewed, mom is aware of LC services and will call PRN.    Maternal Data Formula Feeding for Exclusion: Yes Reason for exclusion: Mother's choice to formula and breast feed on admission Has patient been taught Hand Expression?: Yes Does the patient have breastfeeding experience prior to this delivery?: No  Feeding Length of feed: 17 min  LATCH Score Latch: Repeated attempts needed to sustain latch, nipple held in mouth throughout feeding, stimulation needed to elicit sucking reflex.  Audible Swallowing: None(walked in when baby  was already feeding, mom didn't pay attention if there were swallows in the beginning of the feeding)  Type of Nipple: Everted at rest and after stimulation  Comfort (Breast/Nipple): Soft / non-tender  Hold (Positioning): Assistance needed to correctly position infant at breast and maintain latch.  LATCH Score: 6  Interventions Interventions: Breast feeding basics reviewed;Assisted with latch;Skin to skin;Breast massage;Breast compression;Adjust position;Support pillows;Hand pump  Lactation Tools Discussed/Used Tools: Pump Breast pump type: Manual WIC Program: Yes Pump Review: Setup, frequency, and cleaning;Milk Storage Initiated by:: MPeck Date initiated:: 03/15/18   Consult Status Consult Status: Follow-up Date: 03/16/18 Follow-up type: In-patient    Allison Hamilton 03/15/2018, 7:01 PM

## 2018-03-15 NOTE — Progress Notes (Signed)
Labor Progress Note  Allison Hamilton is a 22 y.o. G3P0020 at [redacted]w[redacted]d admitted for active labor  S: Resting comfortably with epidural.   O:  BP 111/70   Pulse 72   Temp 98.3 F (36.8 C) (Axillary)   Resp 16   Ht 5\' 2"  (1.575 m)   Wt 75 kg (165 lb 6 oz)   LMP 06/03/2017 (Approximate)   SpO2 100%   BMI 30.25 kg/m   No intake/output data recorded.  FHT:  FHR: 115 bpm, variability: moderate,  accelerations:  Present,  decelerations:  Present variable UC:   regular, every 2-4 minutes SVE:   Dilation: 8 Effacement (%): 100 Station: Plus 1 Exam by:: Allison GlassmanPhelps MD AROM: 0315 clear  Labs: Lab Results  Component Value Date   WBC 13.6 (H) 03/14/2018   HGB 11.4 (L) 03/14/2018   HCT 34.5 (L) 03/14/2018   MCV 88.2 03/14/2018   PLT 186 03/14/2018    Assessment / Plan: 22 y.o. G3P0020 815w5d in active labor Spontaneous labor, progressing normally  Labor: Progressing normally, AROM'd Fetal Wellbeing:  Reassuring fetal tracing, Cat 2 Pain Control:  Epidural Anticipated MOD:  NSVD  Expectant management   Allison AdaJazma Phelps, DO OB Fellow Center for [facility], [facility]

## 2018-03-15 NOTE — Anesthesia Procedure Notes (Signed)
Epidural Patient location during procedure: OB  Staffing Anesthesiologist: Conni Knighton, MD Performed: anesthesiologist   Preanesthetic Checklist Completed: patient identified, pre-op evaluation, timeout performed, IV checked, risks and benefits discussed and monitors and equipment checked  Epidural Patient position: sitting Prep: site prepped and draped and DuraPrep Patient monitoring: heart rate, continuous pulse ox and blood pressure Approach: midline Location: L3-L4 Injection technique: LOR air and LOR saline  Needle:  Needle type: Tuohy  Needle gauge: 17 G Needle length: 9 cm Needle insertion depth: 6 cm Catheter type: closed end flexible Catheter size: 19 Gauge Catheter at skin depth: 11 cm Test dose: negative  Assessment Sensory level: T8 Events: blood not aspirated, injection not painful, no injection resistance, negative IV test and no paresthesia  Additional Notes Reason for block:procedure for pain     

## 2018-03-16 MED ORDER — FENTANYL CITRATE (PF) 100 MCG/2ML IJ SOLN: 100.0000 ug | INTRAMUSCULAR | Status: DC | PRN

## 2018-03-16 MED ORDER — ACETAMINOPHEN 325 MG PO TABS: 650.0000 mg | ORAL_TABLET | ORAL | Status: DC | PRN

## 2018-03-16 MED ORDER — OXYTOCIN BOLUS FROM INFUSION: 500.0000 mL | Freq: Once | INTRAVENOUS | Status: DC

## 2018-03-16 MED ORDER — OXYCODONE-ACETAMINOPHEN 5-325 MG PO TABS: 2.0000 | ORAL_TABLET | ORAL | Status: DC | PRN

## 2018-03-16 MED ORDER — ONDANSETRON HCL 4 MG/2ML IJ SOLN: 4.0000 mg | Freq: Four times a day (QID) | INTRAMUSCULAR | Status: DC | PRN

## 2018-03-16 MED ORDER — OXYTOCIN 40 UNITS IN LACTATED RINGERS INFUSION - SIMPLE MED: 2.5000 [IU]/h | INTRAVENOUS | Status: DC

## 2018-03-16 MED ORDER — OXYCODONE-ACETAMINOPHEN 5-325 MG PO TABS: 1.0000 | ORAL_TABLET | ORAL | Status: DC | PRN

## 2018-03-16 MED ORDER — LACTATED RINGERS IV SOLN: INTRAVENOUS | Status: DC

## 2018-03-16 MED ORDER — LACTATED RINGERS IV SOLN: 500.0000 mL | INTRAVENOUS | Status: DC | PRN

## 2018-03-16 MED ORDER — SOD CITRATE-CITRIC ACID 500-334 MG/5ML PO SOLN: 30.0000 mL | ORAL | Status: DC | PRN

## 2018-03-16 MED ORDER — LIDOCAINE HCL (PF) 1 % IJ SOLN: 30.0000 mL | INTRAMUSCULAR | Status: DC | PRN

## 2018-03-16 NOTE — Progress Notes (Addendum)
Post Partum Day 1 Subjective: no complaints, up ad lib, voiding, tolerating PO and + flatus  Objective: Blood pressure 122/70, pulse 67, temperature 98.1 F (36.7 C), temperature source Oral, resp. rate 17, height 5\' 2"  (1.575 m), weight 165 lb 6 oz (75 kg), last menstrual period 06/03/2017, SpO2 99 %, unknown if currently breastfeeding.  Physical Exam:  General: alert, cooperative and no distress Lochia: appropriate Uterine Fundus: firm Incision: n/a DVT Evaluation: No evidence of DVT seen on physical exam.  Recent Labs    03/14/18 2335  HGB 11.4*  HCT 34.5*    Assessment/Plan: Plan for discharge tomorrow   LOS: 2 days   Allison Hamilton 03/16/2018, 9:56 AM
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
## 2018-03-17 ENCOUNTER — Inpatient Hospital Stay (HOSPITAL_COMMUNITY): Admission: RE | Admit: 2018-03-17 | Payer: Medicaid Other | Source: Ambulatory Visit

## 2018-03-17 MED ORDER — IBUPROFEN 600 MG PO TABS: 600.0000 mg | ORAL_TABLET | Freq: Four times a day (QID) | ORAL | 0 refills | Status: DC

## 2018-03-17 NOTE — Discharge Summary (Signed)
OB Discharge Summary     Patient Name: Allison Hamilton DOB: May 21, 1996 MRN: [id number]  Date of admission: 03/14/2018 Delivering MD: Pincus Large   Date of discharge: 03/17/2018  Admitting diagnosis: 40 WKS, CTX Intrauterine pregnancy: [redacted]w[redacted]d     Secondary diagnosis:  Active Problems:   Post term pregnancy   SVD (spontaneous vaginal delivery)   Normal labor  Additional problems: none     Discharge diagnosis: Term Pregnancy Delivered                                                                                                Post partum procedures:none  Augmentation: AROM  Complications: None  Hospital course:  Onset of Labor With Vaginal Delivery     22 y.o. yo [id number] at [redacted]w[redacted]d was admitted in Active Labor on 03/14/2018. Patient had an uncomplicated labor course as follows:  Membrane Rupture Time/Date: 3:15 AM ,03/15/2018   Intrapartum Procedures: Episiotomy: None [1]                                         Lacerations:  Sulcus [9];Labial [10];1st degree [2]  Patient had a delivery of a Viable infant. 03/15/2018  Information for the patient's newborn:  Kitara, Hebb [[id number]]  Delivery Method: Vaginal, Spontaneous(Filed from Delivery Summary)    Pateint had an uncomplicated postpartum course.  She is ambulating, tolerating a regular diet, passing flatus, and urinating well. Patient is discharged home in stable condition on 03/17/18.   Physical exam  Vitals:   03/16/18 1455 03/16/18 2302 03/17/18 0607 03/17/18 0730  BP: 115/69 109/71 123/80 109/70  Pulse: 71 89 82 98  Resp: 18     Temp: 98.1 F (36.7 C) 98.3 F (36.8 C) 98.1 F (36.7 C)   TempSrc: Oral Oral Oral   SpO2:      Weight:      Height:       General: alert, cooperative and no distress Lochia: appropriate Uterine Fundus: firm Incision: N/A DVT Evaluation: No evidence of DVT seen on physical exam. Labs: Lab Results  Component Value Date   WBC 13.6 (H) 03/14/2018   HGB 11.4 (L)  03/14/2018   HCT 34.5 (L) 03/14/2018   MCV 88.2 03/14/2018   PLT 186 03/14/2018   CMP Latest Ref Rng & Units 12/05/2017  Glucose 65 - 99 mg/dL 83  BUN 6 - 20 mg/dL 8  Creatinine 1.30 - 8.65 mg/dL 7.84(O)  Sodium 962 - 952 mmol/L 134(L)  Potassium 3.5 - 5.1 mmol/L 3.6  Chloride 101 - 111 mmol/L 104  CO2 22 - 32 mmol/L 19(L)  Calcium 8.9 - 10.3 mg/dL 8.4(X)  Total Protein 6.5 - 8.1 g/dL 7.2  Total Bilirubin 0.3 - 1.2 mg/dL 0.5  Alkaline Phos 38 - 126 U/L 71  AST 15 - 41 U/L 19  ALT 14 - 54 U/L 10(L)    Discharge instruction: per After Visit Summary and "Baby and Me Booklet".  After visit meds:   Ibuprofen 600 mg Q  6 hours PRN  Diet: routine diet  Activity: Advance as tolerated. Pelvic rest for 6 weeks.   Outpatient follow up:6 weeks Follow up Appt: Future Appointments  Date Time Provider Department Center  04/12/2018  2:00 PM Orvilla Cornwallenney, Rachelle A, CNM [facility] None   Follow up Visit:No follow-ups on file.  Postpartum contraception: Undecided  Newborn Data: Live born female  Birth Weight: 7 lb 12.5 oz (3530 g) APGAR: 8, 9  Newborn Delivery   Birth date/time:  03/15/2018 06:05:00 Delivery type:  Vaginal, Spontaneous     Baby Feeding: both Disposition:home with mother   03/17/2018 Sharen CounterLisa Leftwich-Kirby, CNM

## 2018-03-17 NOTE — Lactation Note (Signed)
This note was copied from a baby's chart. Lactation Consultation Note  Patient Name: Allison Hamilton AVWUJ'WToday's Date: 03/17/2018  Mom states baby is latching well.  She is c/o nipple soreness and using colostrum and coconut oil after feeds.  Baby has also had a few bottles.  Weight loss is at 8%.  Discussed milk coming to volume.  Mom has a manual pump for prn use.  Lactation outpatient services and support information reviewed and encouraged prn.   Maternal Data    Feeding Feeding Type: Breast Fed Length of feed: 8 min  LATCH Score                   Interventions    Lactation Tools Discussed/Used     Consult Status      [name], Elysha Daw S 03/17/2018, 11:33 AM

## 2018-03-19 NOTE — Anesthesia Postprocedure Evaluation (Signed)
Anesthesia Post Note  Patient: Allison Hamilton  Procedure(s) Performed: AN AD HOC LABOR EPIDURAL     Patient location during evaluation: Mother Baby Anesthesia Type: Epidural Level of consciousness: awake and alert Pain management: pain level controlled Vital Signs Assessment: post-procedure vital signs reviewed and stable Respiratory status: spontaneous breathing, nonlabored ventilation and respiratory function stable Cardiovascular status: stable Postop Assessment: no headache, no backache and epidural receding Anesthetic complications: no    Last Vitals: There were no vitals filed for this visit.  Last Pain: There were no vitals filed for this visit.               Lewie LoronJohn Stormy Connon

## 2018-04-12 ENCOUNTER — Ambulatory Visit: Payer: Self-pay | Admitting: Obstetrics

## 2018-04-19 ENCOUNTER — Ambulatory Visit (INDEPENDENT_AMBULATORY_CARE_PROVIDER_SITE_OTHER): Payer: Medicaid Other | Admitting: Obstetrics

## 2018-04-19 ENCOUNTER — Encounter: Payer: Self-pay | Admitting: Obstetrics

## 2018-04-19 DIAGNOSIS — Z3009 Encounter for other general counseling and advice on contraception: Secondary | ICD-10-CM

## 2018-04-19 DIAGNOSIS — Z30013 Encounter for initial prescription of injectable contraceptive: Secondary | ICD-10-CM

## 2018-04-19 DIAGNOSIS — Z1389 Encounter for screening for other disorder: Secondary | ICD-10-CM | POA: Diagnosis not present

## 2018-04-19 MED ORDER — MEDROXYPROGESTERONE ACETATE 150 MG/ML IM SUSP: 150.0000 mg | INTRAMUSCULAR | 4 refills | Status: DC

## 2018-04-19 NOTE — Progress Notes (Signed)
Post Partum Exam  Allison NasutiShaniya Hamilton is a 22 y.o. [id number] female who presents for a postpartum visit. She is 4 weeks postpartum following a spontaneous vaginal delivery. I have fully reviewed the prenatal and intrapartum course. The delivery was at 40 gestational weeks.  Anesthesia: epidural. Postpartum course has been uncomplicated. Baby's course has been uncomplicated. Baby is feeding by bottle - Gerber Gentle. Bleeding staining only. Bowel function is normal. Bladder function is normal. Patient is not sexually active. Contraception method is none. Postpartum depression screening:neg  The following portions of the patient's history were reviewed and updated as appropriate: allergies, current medications, past family history, past medical history, past social history, past surgical history and problem list.   Review of Systems A comprehensive review of systems was negative.    Objective:  unknown if currently breastfeeding.  General:  alert and no distress   Breasts:  inspection negative, no nipple discharge or bleeding, no masses or nodularity palpable  Lungs: clear to auscultation bilaterally  Heart:  regular rate and rhythm, S1, S2 normal, no murmur, click, rub or gallop  Abdomen: soft, non-tender; bowel sounds normal; no masses,  no organomegaly   Assessment:   1. Postpartum care following vaginal delivery - doing well  2. Encounter for other general counseling and advice on contraception - wants Depo Provera  3. Encounter for initial prescription of injectable contraceptive Rx: - medroxyPROGESTERone (DEPO-PROVERA) 150 MG/ML injection; Inject 1 mL (150 mg total) into the muscle every 3 (three) months.  Dispense: 1 mL; Refill: 4   Plan:   1. Contraception: Depo-Provera injections 2. Depo Provera Rx 3. Follow up in: 4 weeks or as needed.    Brock BadHARLES A. HARPER MD 04-19-2018

## 2018-05-17 ENCOUNTER — Encounter: Payer: Self-pay | Admitting: Obstetrics

## 2018-05-17 ENCOUNTER — Ambulatory Visit (INDEPENDENT_AMBULATORY_CARE_PROVIDER_SITE_OTHER): Payer: Medicaid Other | Admitting: Obstetrics

## 2018-05-17 DIAGNOSIS — Z3009 Encounter for other general counseling and advice on contraception: Secondary | ICD-10-CM

## 2018-05-17 DIAGNOSIS — Z30013 Encounter for initial prescription of injectable contraceptive: Secondary | ICD-10-CM

## 2018-05-17 DIAGNOSIS — Z3042 Encounter for surveillance of injectable contraceptive: Secondary | ICD-10-CM

## 2018-05-17 DIAGNOSIS — Z3202 Encounter for pregnancy test, result negative: Secondary | ICD-10-CM

## 2018-05-17 DIAGNOSIS — N946 Dysmenorrhea, unspecified: Secondary | ICD-10-CM

## 2018-05-17 LAB — POCT URINE PREGNANCY: Preg Test, Ur: NEGATIVE

## 2018-05-17 MED ORDER — IBUPROFEN 800 MG PO TABS: 800.0000 mg | ORAL_TABLET | Freq: Three times a day (TID) | ORAL | 5 refills | Status: DC | PRN

## 2018-05-17 MED ORDER — MEDROXYPROGESTERONE ACETATE 150 MG/ML IM SUSP
150.0000 mg | INTRAMUSCULAR | Status: DC
  Administered 2018-05-17: 150 mg via INTRAMUSCULAR

## 2018-05-17 NOTE — Progress Notes (Signed)
Patient is in the office for follow up. Pt complains of itching near stitches. Vaginal delivery on 03-15-18, [facility]

## 2018-05-17 NOTE — Progress Notes (Signed)
Administrations This Visit    medroxyPROGESTERone (DEPO-PROVERA) injection 150 mg    Admin Date 05/17/2018 Action Given Dose 150 mg Route Intramuscular Administered By Lewayne BuntingGardner, Arnel Wymer, CMA        Used office supply Depo

## 2018-05-18 ENCOUNTER — Encounter: Payer: Self-pay | Admitting: Obstetrics

## 2018-05-18 NOTE — Progress Notes (Signed)
Subjective:     Allison Hamilton is a 22 y.o. female who presents for a postpartum visit. She is 8 weeks postpartum following a spontaneous vaginal delivery. I have fully reviewed the prenatal and intrapartum course. The delivery was at 40 gestational weeks. Outcome: spontaneous vaginal delivery. Anesthesia: epidural. Postpartum course has been normal. Baby's course has been normal. Baby is feeding by bottle Rush Barer- Gerber. Bleeding no bleeding. Bowel function is normal. Bladder function is normal. Patient is not sexually active. Contraception method is abstinence. Postpartum depression screening: negative.  Tobacco, alcohol and substance abuse history reviewed.  Adult immunizations reviewed including TDAP, rubella and varicella.  The following portions of the patient's history were reviewed and updated as appropriate: allergies, current medications, past family history, past medical history, past social history, past surgical history and problem list.  Review of Systems A comprehensive review of systems was negative.   Objective:    BP 137/70   Pulse 79   Wt 143 lb 8 oz (65.1 kg)   Breastfeeding? No   BMI 26.25 kg/m   General:  alert and no distress   Breasts:  inspection negative, no nipple discharge or bleeding, no masses or nodularity palpable  Lungs: clear to auscultation bilaterally  Heart:  regular rate and rhythm, S1, S2 normal, no murmur, click, rub or gallop  Abdomen: soft, non-tender; bowel sounds normal; no masses,  no organomegaly   Vulva:  normal  Vagina: normal vagina  Cervix:  no cervical motion tenderness  Corpus: normal size, contour, position, consistency, mobility, non-tender  Adnexa:  no mass, fullness, tenderness  Rectal Exam: Not performed.          50% of 15 min visit spent on counseling and coordination of care.   Assessment:     1. Postpartum care following vaginal delivery  2. Dysmenorrhea Rx: - ibuprofen (ADVIL,MOTRIN) 800 MG tablet; Take 1 tablet (800 mg  total) by mouth every 8 (eight) hours as needed.  Dispense: 30 tablet; Refill: 5  3. Encounter for other general counseling and advice on contraception - wants Depo Provera  4. Encounter for initial prescription of injectable contraceptive Rx: - medroxyPROGESTERone (DEPO-PROVERA) injection 150 mg - POCT urine pregnancy  Plan:    1. Contraception: Depo-Provera injections 2. Depo Provera Rx 3. Follow up in: 3 months or as needed.   Healthy lifestyle practices reviewed   Brock BadHARLES A. Earvin Blazier MD 05-17-2018

## 2019-03-23 IMAGING — US US MFM OB COMP +14 WKS
1 series · 14 of 28 positions shown · non-contrast
Comparison: none

[Series 1: us mfm ob comp +14 wks · 95 acquisitions, 14 frames shown]
[im 4/95]
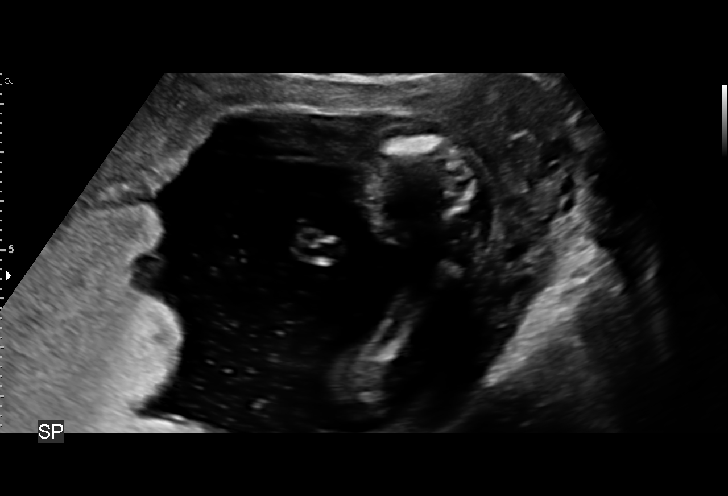
[im 11/95]
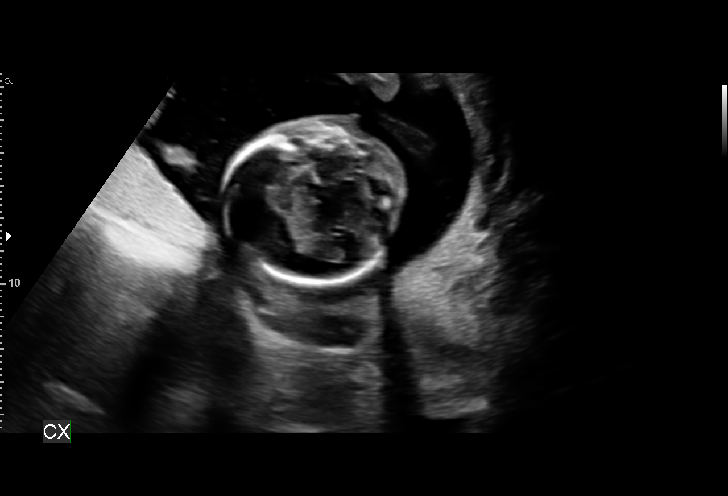
[im 18/95]
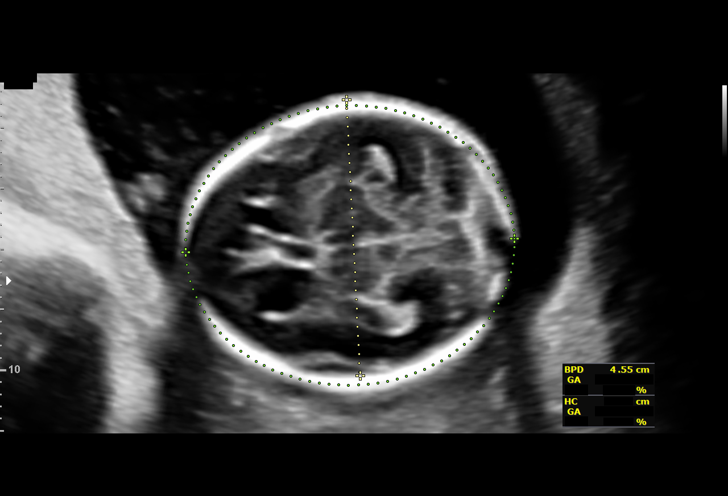
[im 25/95]
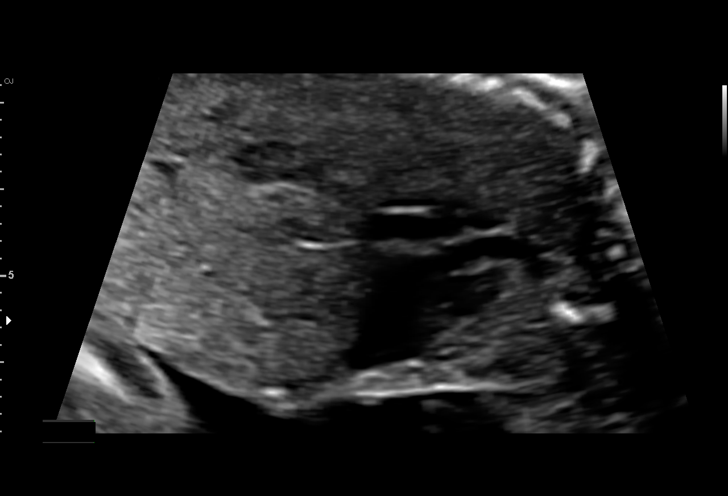
[im 32/95]
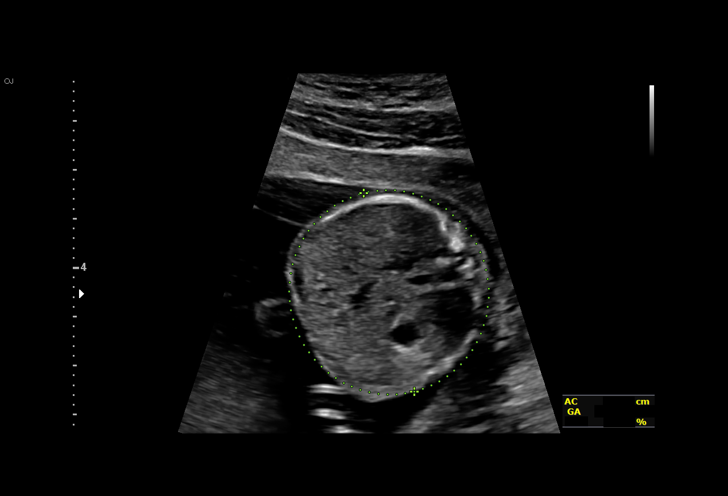
[im 39/95]
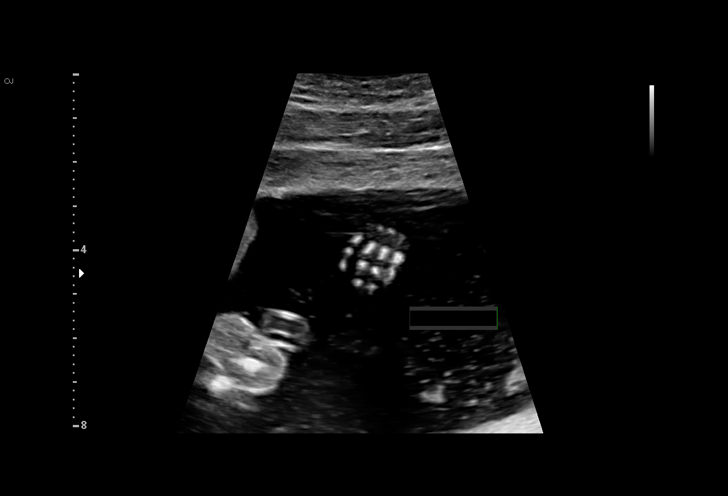
[im 46/95]
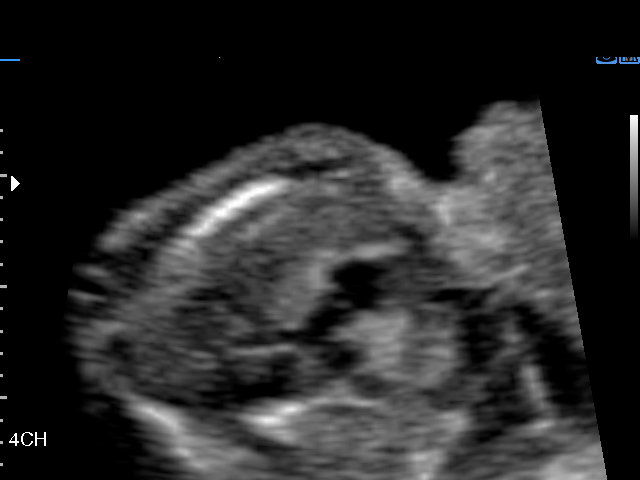
[im 53/95]
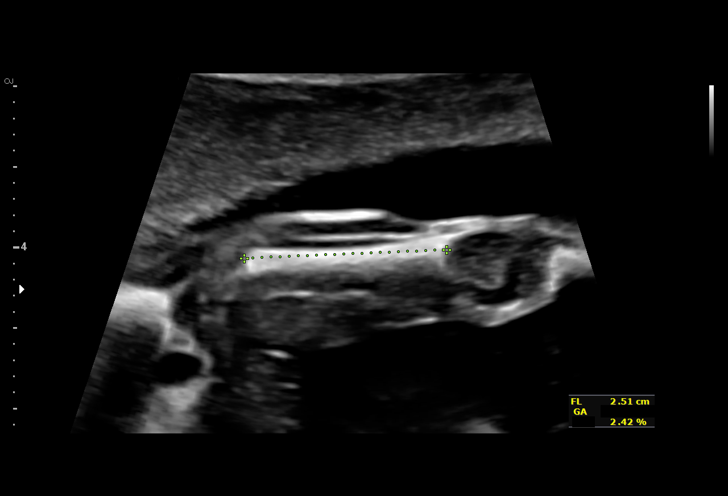
[im 60/95]
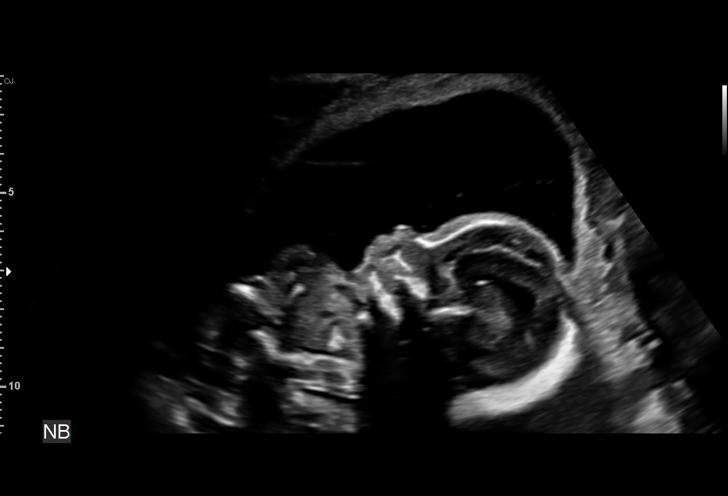
[im 67/95]
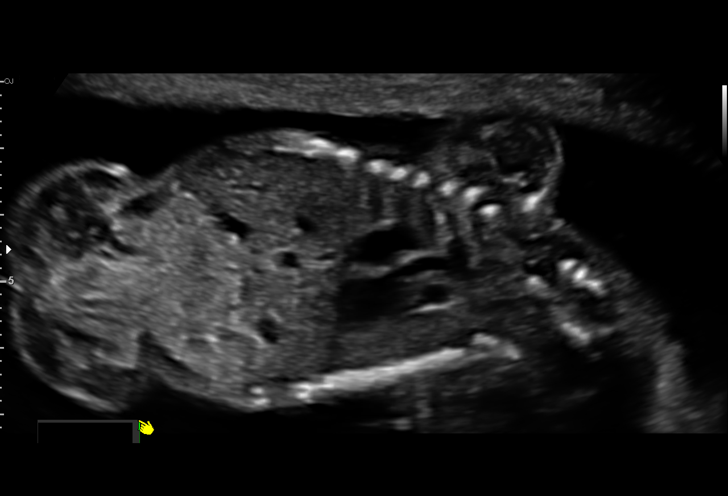
[im 74/95]
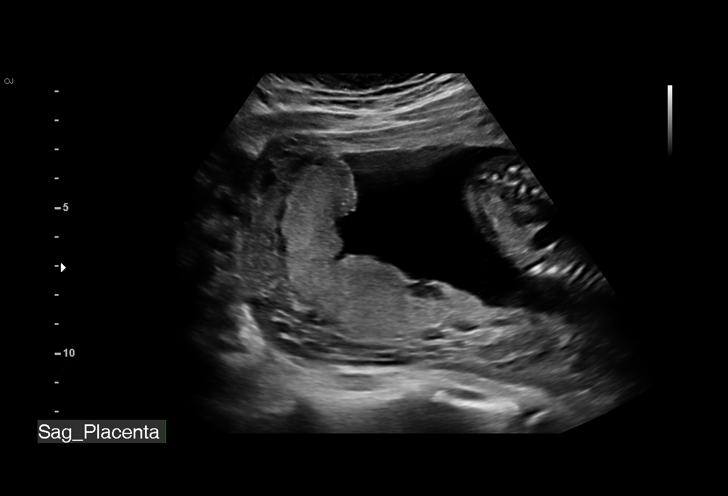
[im 81/95]
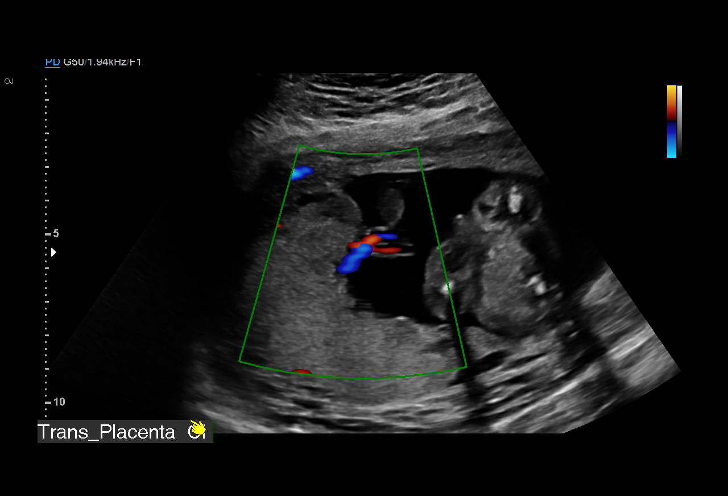
[im 88/95]
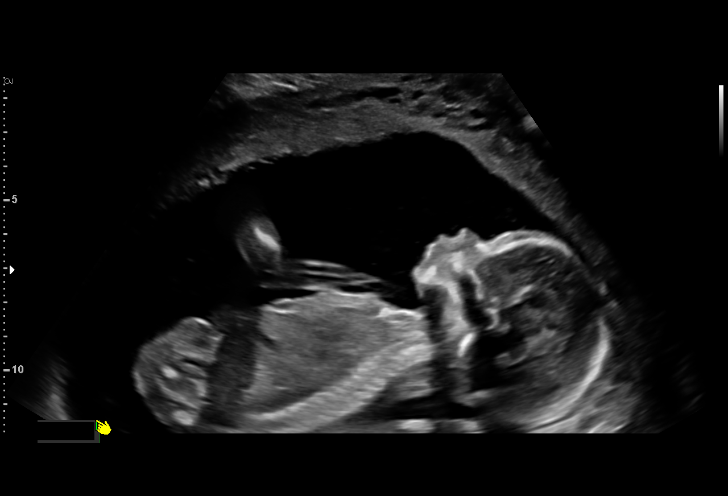
[im 95/95]
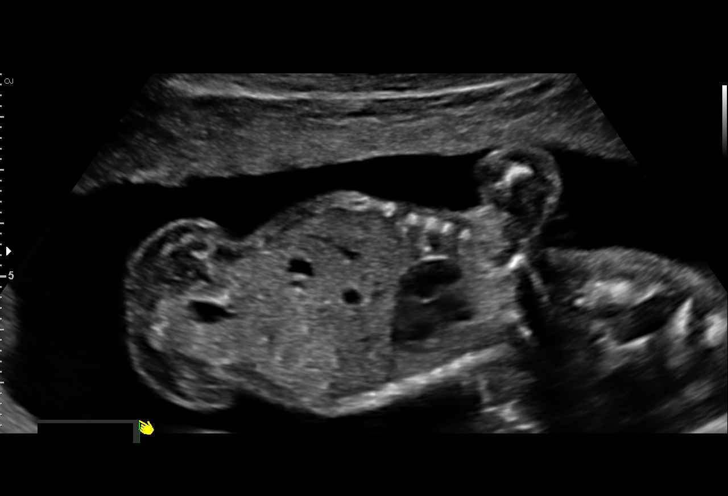

[14 of 28 positions shown; findings below may reference images not displayed]

MAU/Triage

1  DAVIS JIM           [id number]      [id number]     [id number]
Indications

19 weeks gestation of pregnancy
Encounter for antenatal screening for
malformations
Low risk NIPS
Family history of congenital anomaly (Lanzai
bifida)
OB History

Gravidity:    2          SAB:   1
Living:       0
Fetal Evaluation

Num Of Fetuses:     1
Fetal Heart         144
Rate(bpm):
Cardiac Activity:   Observed
Presentation:       Cephalic
Placenta:           Fundal, above cervical os
P. Cord Insertion:  Visualized

Amniotic Fluid
AFI FV:      Subjectively within normal limits

Largest Pocket(cm)
4.5
Biometry
BPD:      45.4  mm     G. Age:  19w 5d         63  %    CI:        84.33   %    70 - 86
FL/HC:      16.7   %    16.1 -
HC:      155.8  mm     G. Age:  18w 3d          9  %    HC/AC:      1.17        1.09 -
AC:      132.6  mm     G. Age:  18w 5d         24  %    FL/BPD:     57.3   %
FL:         26  mm     G. Age:  17w 6d          5  %    FL/AC:      19.6   %    20 - 24
HUM:      26.8  mm     G. Age:  18w 4d         27  %
CER:      19.6  mm     G. Age:  18w 6d         36  %
NFT:       3.7  mm
CM:        4.1  mm

Est. FW:     239  gm      0 lb 8 oz     27  %
Gestational Age

LMP:           19w 3d        Date:  06/03/17                 EDD:   03/10/18
U/S Today:     18w 5d                                        EDD:   03/15/18
Best:          19w 3d     Det. By:  LMP  (06/03/17)          EDD:   03/10/18
Anatomy

Cranium:               Appears normal         Aortic Arch:            Appears normal
Cavum:                 Appears normal         Ductal Arch:            Appears normal
Ventricles:            Appears normal         Diaphragm:              Appears normal
Choroid Plexus:        Appears normal         Stomach:                Appears normal, left
sided
Cerebellum:            Appears normal         Abdomen:                Appears normal
Posterior Fossa:       Appears normal         Abdominal Wall:         Appears nml (cord
insert, abd wall)
Nuchal Fold:           Appears normal         Cord Vessels:           Appears normal (3
vessel cord)
Face:                  Appears normal         Kidneys:                Appear normal
(orbits and profile)
Lips:                  Appears normal         Bladder:                Appears normal
Thoracic:              Appears normal         Spine:                  Appears normal
Heart:                 Appears normal         Upper Extremities:      Appears normal
(4CH, axis, and situs
RVOT:                  Appears normal         Lower Extremities:      Appears normal
LVOT:                  Appears normal

Other:  Male gender. Heels and 5th digit visualized.
Cervix Uterus Adnexa

Cervix
Length:            3.4  cm.
Normal appearance by transabdominal scan.

Uterus
No abnormality visualized.

Left Ovary
Not visualized.

Right Ovary
Size(cm)        3   x   2.9    x  3.5       Vol(ml):
Within normal limits.

Cul De Sac:   No free fluid seen.

Adnexa:       No abnormality visualized.
Impression

Singleton intrauterine pregnancy at 19+3 weeks with family
history of NTD, here for anatomic survey
Review of the anatomy shows no sonographic markers for
aneuploidy or structural anomalies
All relevant fetal anatomy has been visualized
Amniotic fluid volume is normal
Estimated fetal weight shows growth in the 27th percentile.
AC is in the 24th percentile
Recommendations

Anatomic survey is complete. Recommend follow-up
ultrasound examnination in 4-6 weeks to assess rate of fetal
growth

## 2019-05-27 IMAGING — US US MFM OB FOLLOW-UP
1 series · 14 of 28 positions shown · non-contrast
Comparison: none

[Series 1: us mfm ob follow-up · 31 acquisitions, 14 frames shown]
[im 2/31]
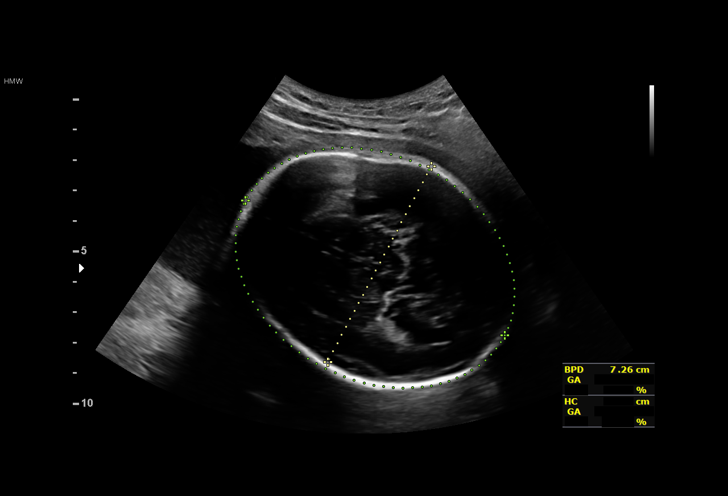
[im 4/31]
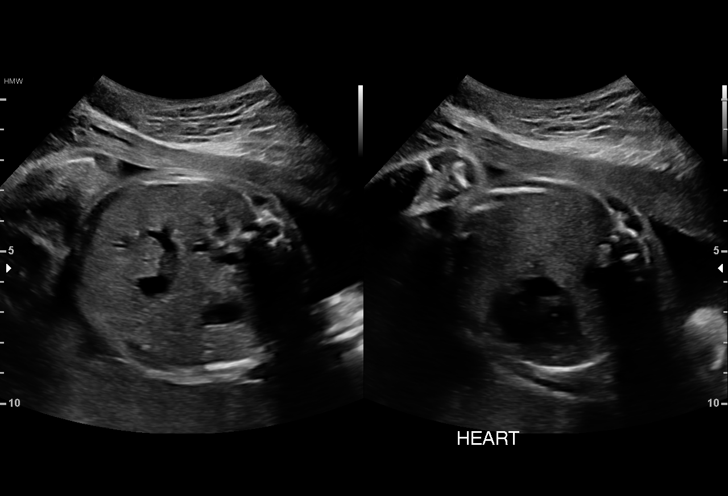
[im 6/31]
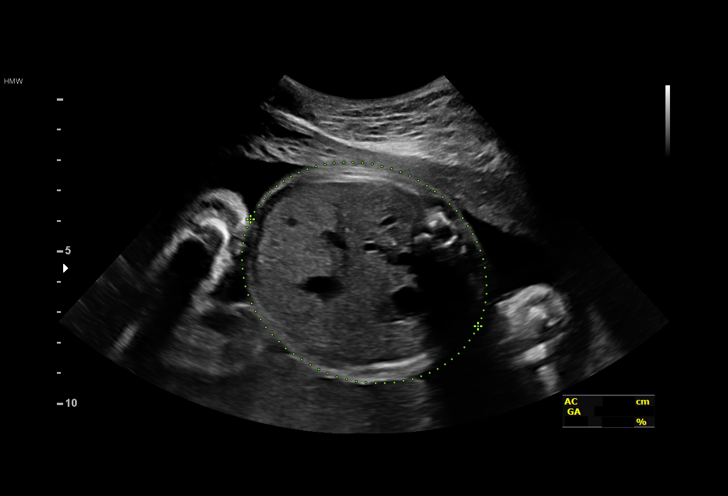
[im 8/31]
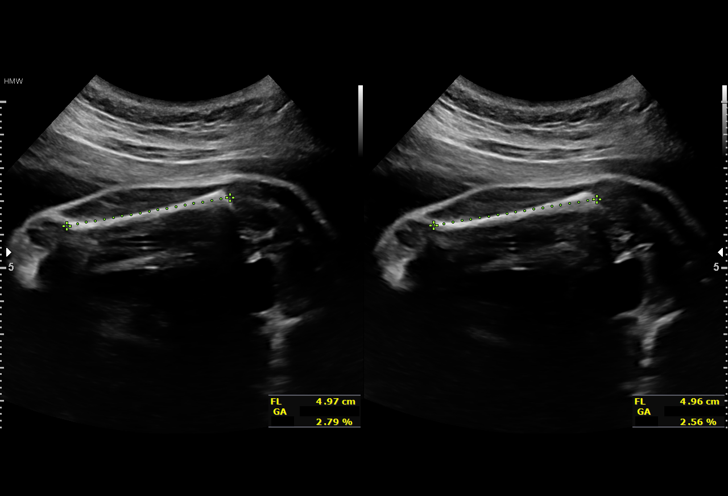
[im 11/31]
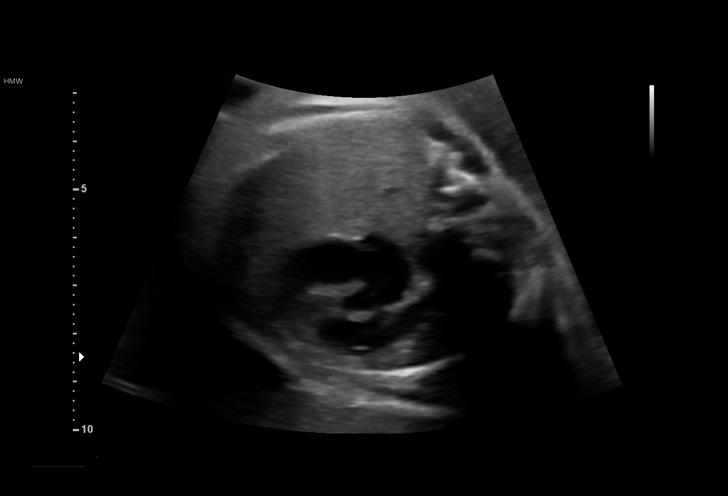
[im 13/31]
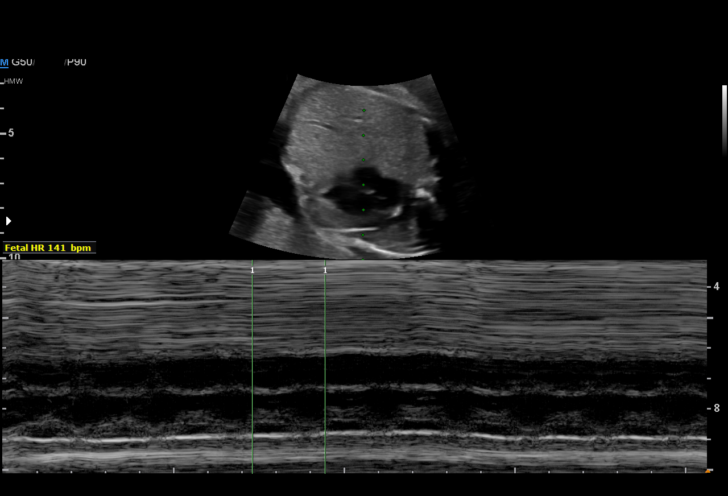
[im 15/31]
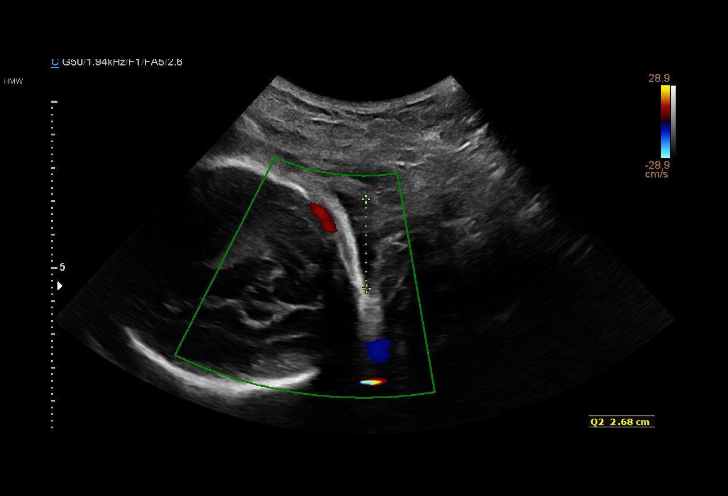
[im 17/31]
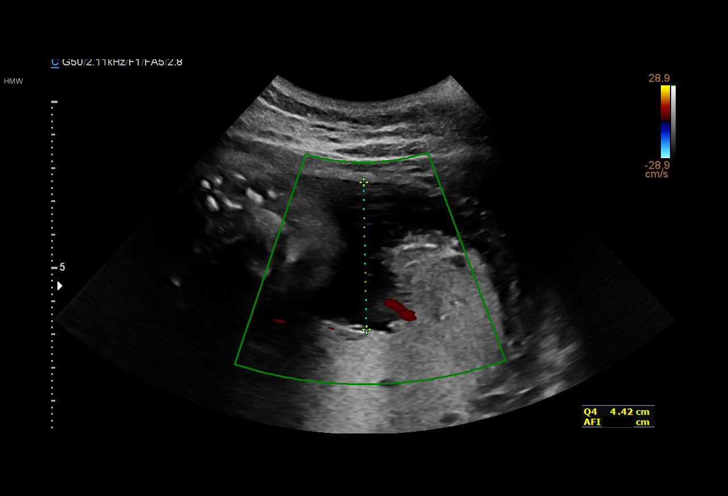
[im 19/31]
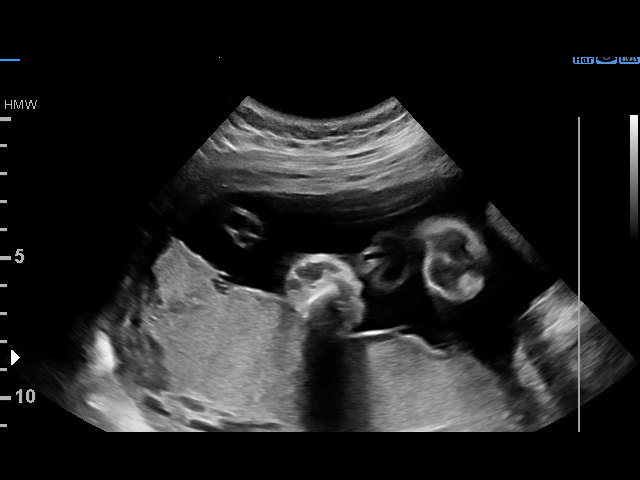
[im 22/31]
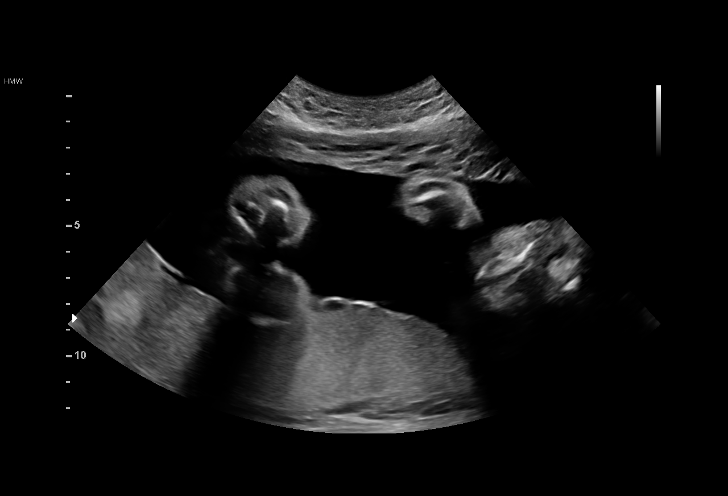
[im 24/31]
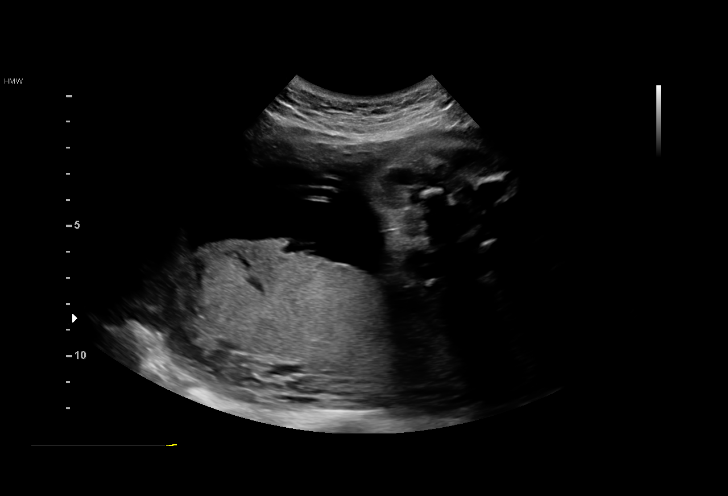
[im 26/31]
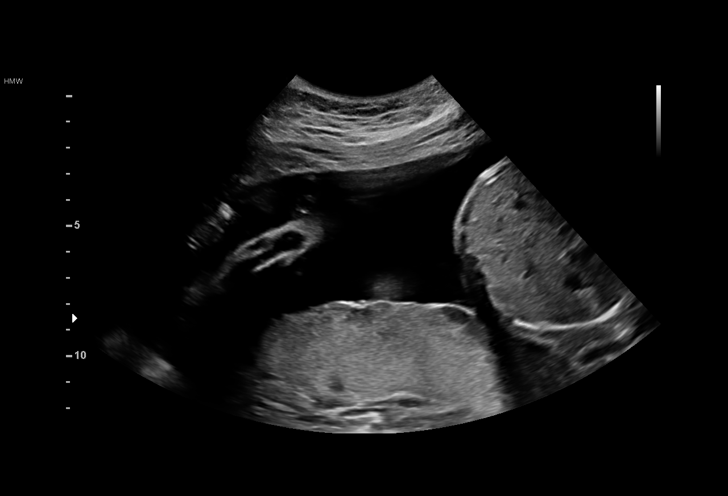
[im 28/31]
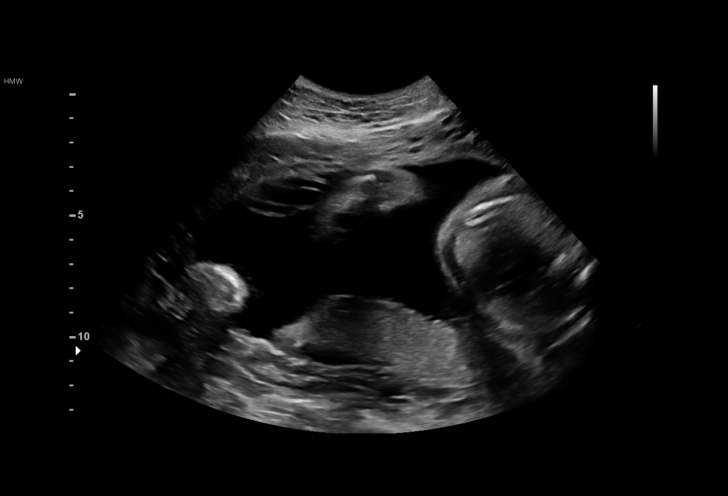
[im 31/31]
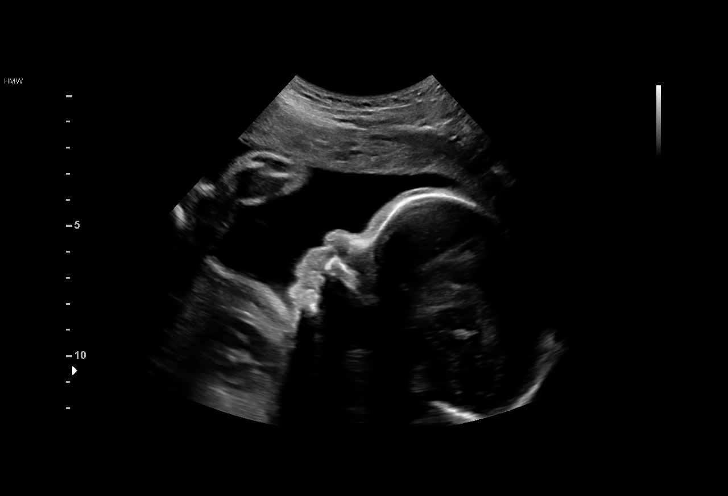

[14 of 28 positions shown; findings below may reference images not displayed]

1  SALONI DARE            [id number]      [id number]     [id number]
Indications

28 weeks gestation of pregnancy
Encounter for other antenatal screening
follow-up
Low risk NIPS
Family history of congenital anomaly (Jepsen
bifida)
OB History

Gravidity:    2          SAB:   1
Living:       0
Fetal Evaluation

Num Of Fetuses:     1
Fetal Heart         141
Rate(bpm):
Cardiac Activity:   Observed
Presentation:       Cephalic
Placenta:           Posterior, above cervical os
P. Cord Insertion:  Visualized, central

Amniotic Fluid
AFI FV:      Subjectively within normal limits

AFI Sum(cm)     %Tile       Largest Pocket(cm)
14.72           51

RUQ(cm)       RLQ(cm)       LUQ(cm)        LLQ(cm)
3.52
Biometry
BPD:      72.2  mm     G. Age:  29w 0d         46  %    CI:        73.92   %    70 - 86
FL/HC:      18.6   %    19.6 -
HC:      266.7  mm     G. Age:  29w 0d         28  %    HC/AC:      1.12        0.99 -
AC:      238.8  mm     G. Age:  28w 1d         27  %    FL/BPD:     68.8   %    71 - 87
FL:       49.7  mm     G. Age:  26w 5d        < 3  %    FL/AC:      20.8   %    20 - 24
HUM:      46.6  mm     G. Age:  27w 3d         18  %

Est. FW:    9946  gm      2 lb 8 oz     34  %
Gestational Age

LMP:           28w 5d        Date:  06/03/17                 EDD:   03/10/18
U/S Today:     28w 2d                                        EDD:   03/13/18
Best:          28w 5d     Det. By:  LMP  (06/03/17)          EDD:   03/10/18
Anatomy

Cranium:               Appears normal         Aortic Arch:            Previously seen
Cavum:                 Previously seen        Ductal Arch:            Previously seen
Ventricles:            Appears normal         Diaphragm:              Previously seen
Choroid Plexus:        Previously seen        Stomach:                Appears normal, left
sided
Cerebellum:            Previously seen        Abdomen:                Previously seen
Posterior Fossa:       Previously seen        Abdominal Wall:         Previously seen
Nuchal Fold:           Previously seen        Cord Vessels:           Previously seen
Face:                  Orbits and profile     Kidneys:                Appear normal
previously seen
Lips:                  Previously seen        Bladder:                Appears normal
Thoracic:              Appears normal         Spine:                  Previously seen
Heart:                 Previously seen        Upper Extremities:      Previously seen
RVOT:                  Appears normal         Lower Extremities:      Previously seen
LVOT:                  Appears normal

Other:  Male gender. Heels and 5th digit visualized previously.
Cervix Uterus Adnexa

Cervix
Not visualized (advanced GA >80wks)

Uterus
No abnormality visualized.

Left Ovary
No adnexal mass visualized.

Right Ovary
No adnexal mass visualized.

Cul De Sac:   No free fluid seen.

Adnexa:       No abnormality visualized.
Impression

Single living intrauterine pregnancy at 28 weeks 5 days.
Appropriate interval fetal growth (34%).
Normal amniotic fluid volume.
Normal interval fetal anatomy.
Recommendations

Recommend follow-up ultrasound examination in 4 weeks to
reassess growth given previously seen poor growth.

## 2019-07-16 IMAGING — US US MFM OB FOLLOW-UP
1 series · 14 of 28 positions shown · non-contrast
Comparison: none

[Series 1: us mfm ob follow-up · 56 acquisitions, 14 frames shown]
[im 3/56]
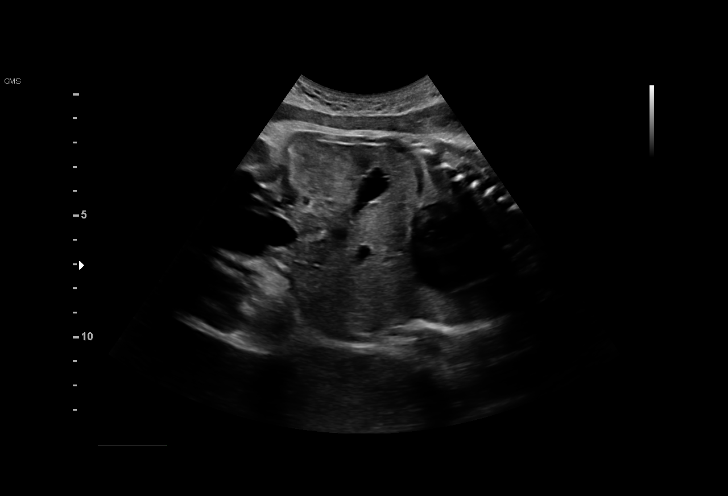
[im 7/56]
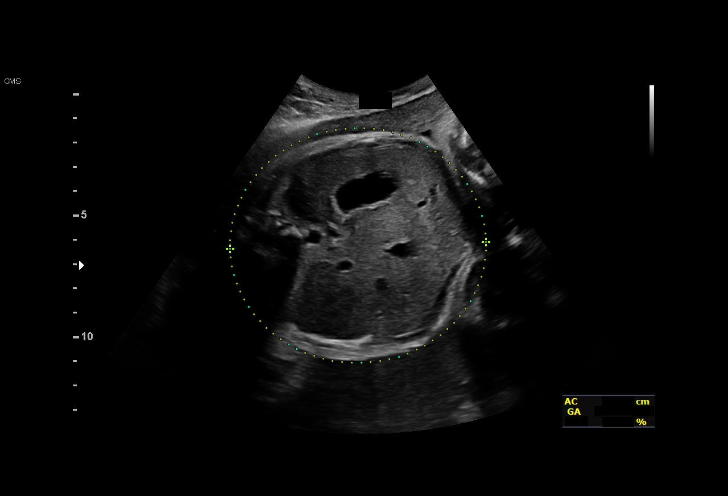
[im 11/56]
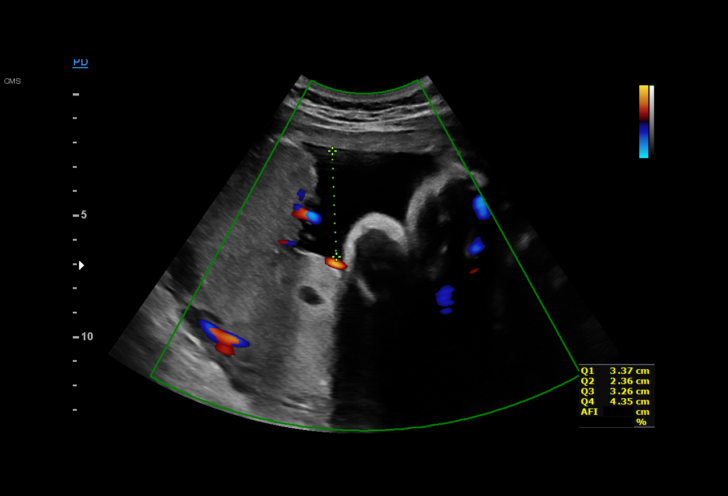
[im 15/56]
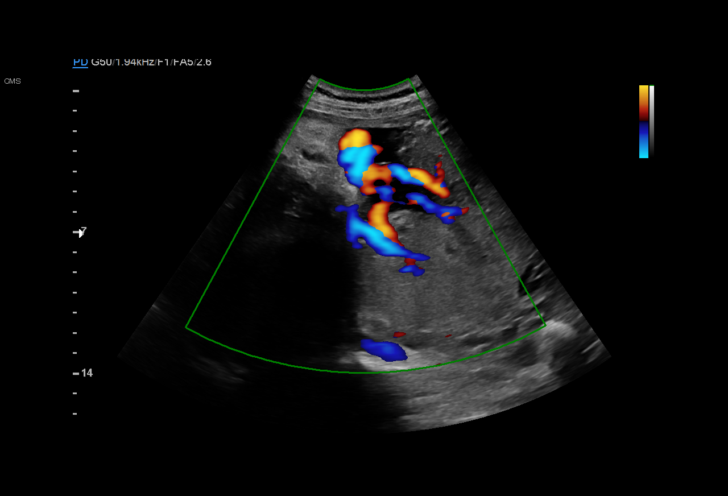
[im 19/56]
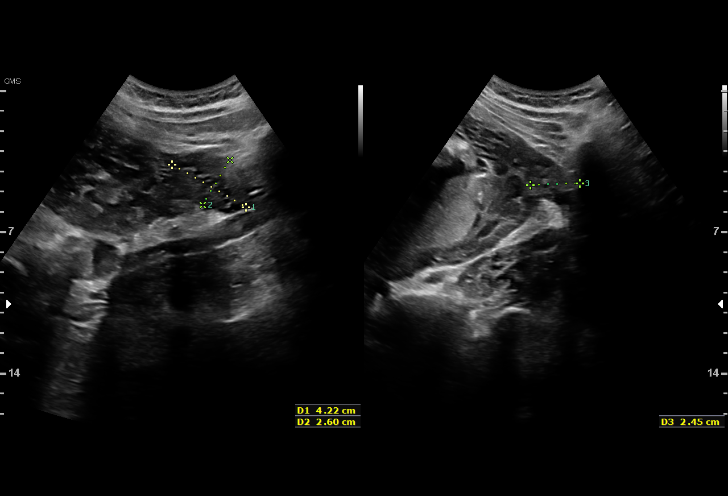
[im 23/56]
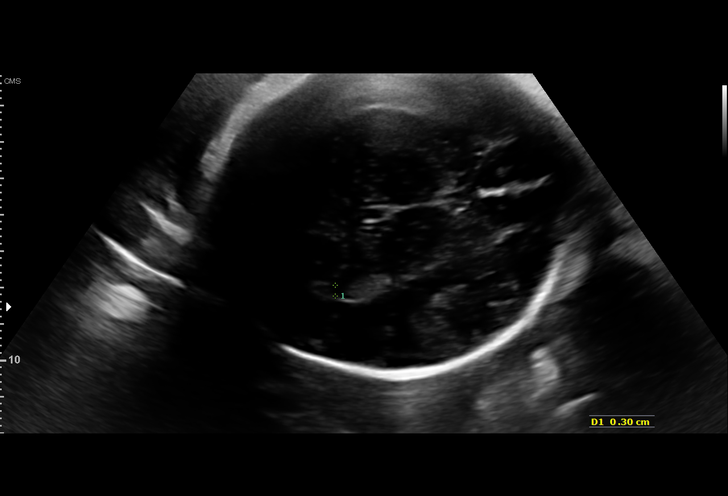
[im 27/56]
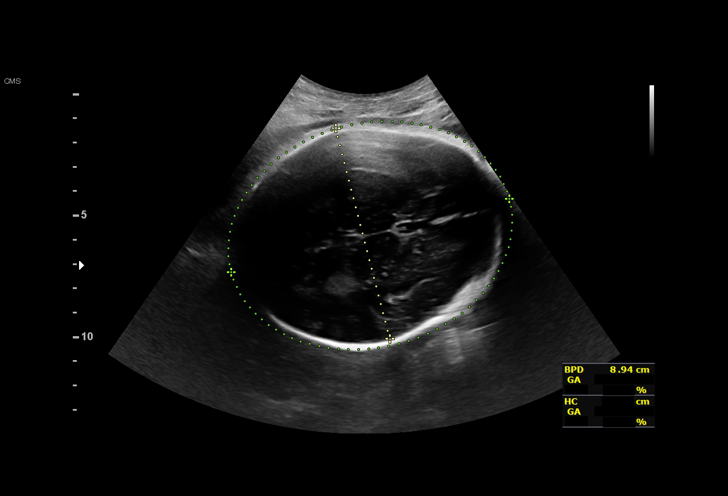
[im 31/56]
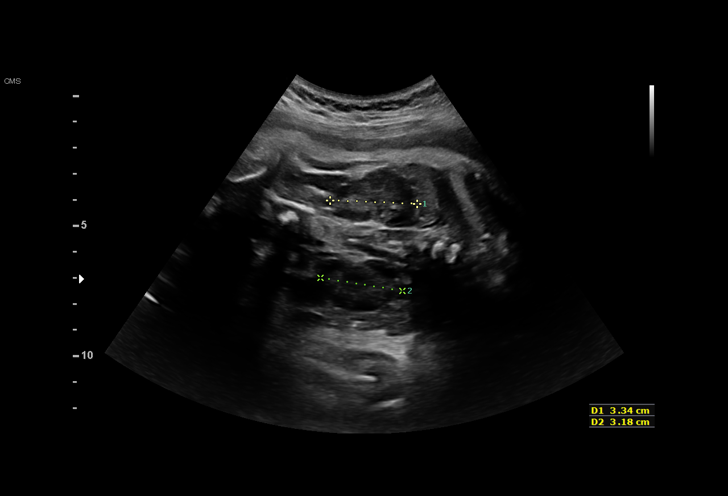
[im 35/56]
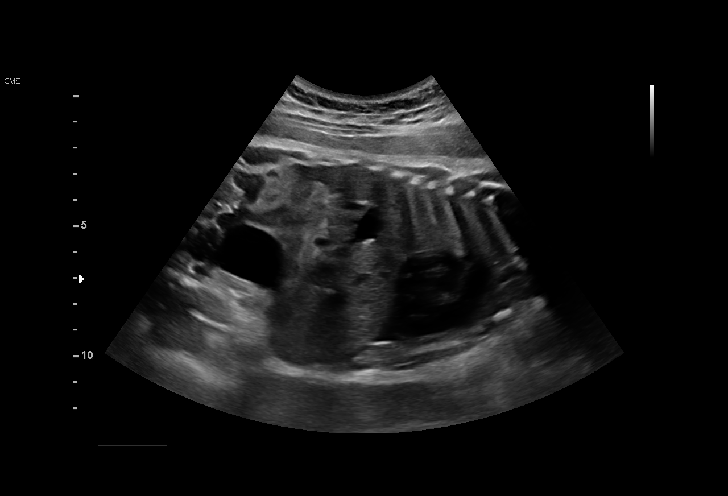
[im 39/56]
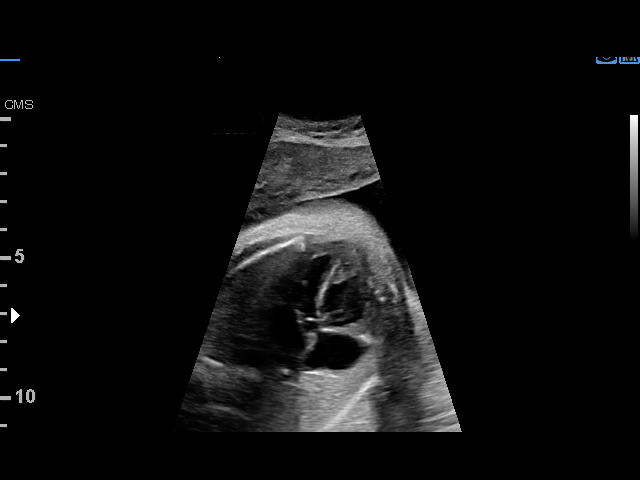
[im 43/56]
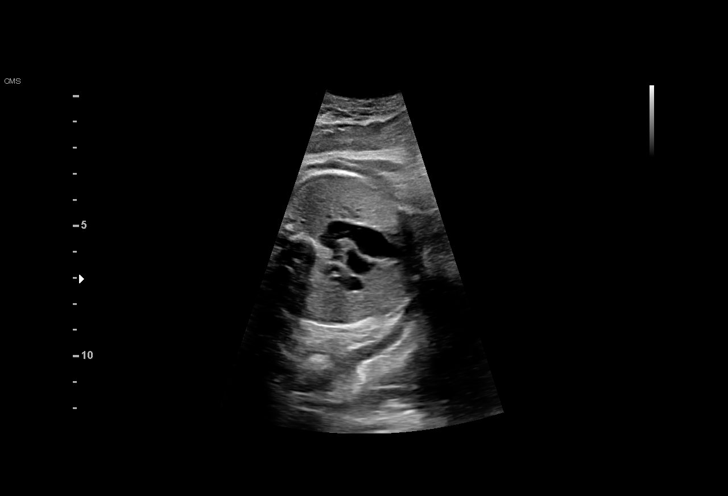
[im 47/56]
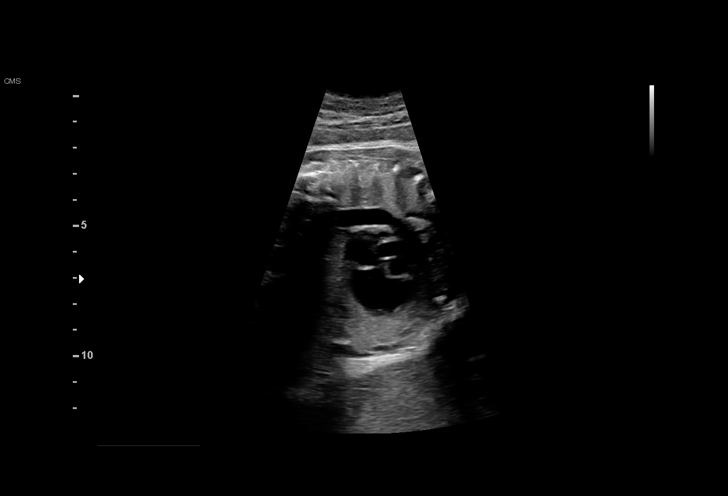
[im 51/56]
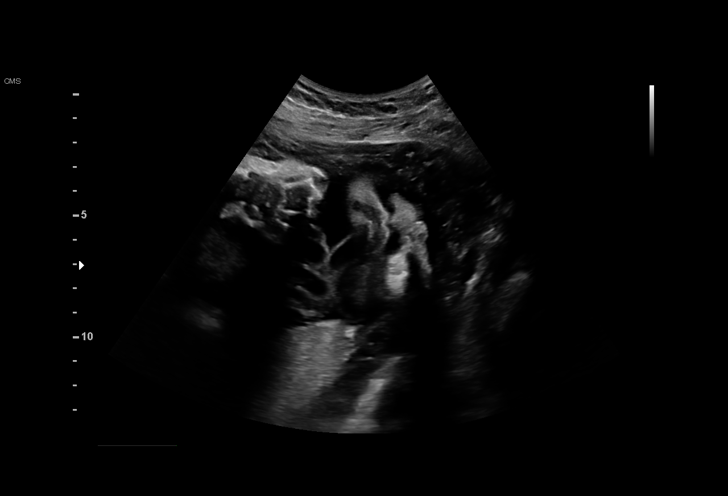
[im 56/56]
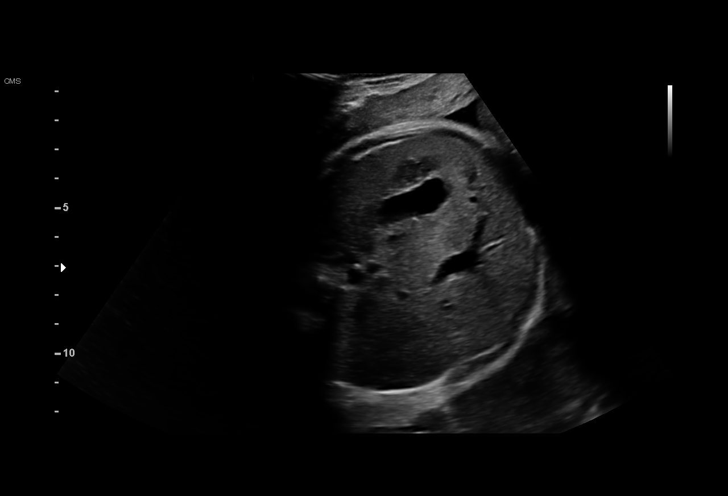

[14 of 28 positions shown; findings below may reference images not displayed]

1  SLOVEN ZOTO            [id number]      [id number]     [id number]
Indications

35 weeks gestation of pregnancy
Encounter for other antenatal screening
follow-up
Family history of congenital anomaly (Cecilia
bifida)
Maternal care for known or suspected poor
fetal growth, third trimester, not applicable or
unspecified
OB History

Gravidity:    2          SAB:   1
Living:       0
Fetal Evaluation

Num Of Fetuses:     1
Fetal Heart         124
Rate(bpm):
Cardiac Activity:   Observed
Presentation:       Cephalic
Placenta:           Posterior, above cervical os
P. Cord Insertion:  Marginal insertion

Amniotic Fluid
AFI FV:      Subjectively within normal limits

AFI Sum(cm)     %Tile       Largest Pocket(cm)
13.34           46

RUQ(cm)       RLQ(cm)       LUQ(cm)        LLQ(cm)
3.37
Biometry

BPD:      89.2  mm     G. Age:  36w 1d         64  %    CI:        71.91   %    70 - 86
FL/HC:      18.7   %    20.1 -
HC:      334.8  mm     G. Age:  38w 2d         77  %    HC/AC:      1.04        0.93 -
AC:      320.6  mm     G. Age:  36w 0d         63  %    FL/BPD:     70.3   %    71 - 87
FL:       62.7  mm     G. Age:  32w 3d        < 3  %    FL/AC:      19.6   %    20 - 24
HUM:      56.3  mm     G. Age:  32w 5d          9  %

Est. FW:    0266  gm    5 lb 13 oz      52  %
Gestational Age

LMP:           35w 6d        Date:  06/03/17                 EDD:   03/10/18
U/S Today:     35w 5d                                        EDD:   03/11/18
Best:          35w 6d     Det. By:  LMP  (06/03/17)          EDD:   03/10/18
Anatomy

Cranium:               Appears normal         Aortic Arch:            Appears normal
Cavum:                 Previously seen        Ductal Arch:            Appears normal
Ventricles:            Appears normal         Diaphragm:              Appears normal
Choroid Plexus:        Appears normal         Stomach:                Appears normal, left
sided
Cerebellum:            Appears normal         Abdomen:                Appears normal
Posterior Fossa:       Appears normal         Abdominal Wall:         Previously seen
Nuchal Fold:           Previously seen        Cord Vessels:           Appears normal (3
vessel cord)
Face:                  Appears normal         Kidneys:                Appear normal
(orbits and profile)
Lips:                  Appears normal         Bladder:                Appears normal
Thoracic:              Appears normal         Spine:                  Previously seen
Heart:                 Appears normal         Upper Extremities:      Previously seen
(4CH, axis, and situs
RVOT:                  Appears normal         Lower Extremities:      Previously seen
LVOT:                  Appears normal

Other:  Male gender. Heels and 5th digit visualized previously.
Cervix Uterus Adnexa

Cervix
Not visualized (advanced GA >42wks)

Uterus
No abnormality visualized.

Left Ovary
Within normal limits.

Right Ovary
Within normal limits.

Cul De Sac:   No free fluid seen.

Adnexa:       No abnormality visualized.
Impression

Single living intrauterine pregnancy at 35w 6d.
Cephalic presentation.
Placenta Posterior, above cervical os.
Normal amniotic fluid volume.
Appropriate interval fetal growth.
Normal interval fetal anatomy.
Recommendations

Follow-up ultrasounds as clinically indicated.

## 2019-09-20 NOTE — L&D Delivery Note (Signed)
DELIVERY NOTE  Pt complete and at +2 station with urge to push. Epidural controlling pain.  Patient made excellent progress during contractions, occiput quickly crowning. Tight band at introitus, FHR in 50s, small median episiotomy cut facilitating delivery of head. Pt pushed and delivered a viable female infant in LOA position. Nuchal cord x1, easily reduced. Anterior and posterior shoulders spontaneously delivered with next two pushes; body easily followed next. Infant placed on mothers abdomen and bulb suction of mouth and nose performed. Cord was then clamped and cut by FOB. Cord blood obtained, 3VC. Baby had a vigorous spontaneous cry noted. Placenta then delivered at 0010 intact. Fundal massage performed and pitocin per protocol. Fundus firm. The following lacerations were noted: median episiotomy. Repaired in routine fashion with 2-0 vicryl Mother and baby stable. Counts correct   Infant time: 0006 Gender: female Placenta time: 0010 Apgars: 8/9 Weight: pending skin-to-skin

## 2019-10-02 ENCOUNTER — Inpatient Hospital Stay (HOSPITAL_COMMUNITY)
Admission: AD | Admit: 2019-10-02 | Discharge: 2019-10-02 | Disposition: A | Discharge status: Home / Self Care | Payer: Medicaid Other | Attending: Obstetrics and Gynecology | Admitting: Obstetrics and Gynecology

## 2019-10-02 ENCOUNTER — Inpatient Hospital Stay (HOSPITAL_COMMUNITY): Payer: Medicaid Other

## 2019-10-02 ENCOUNTER — Encounter (HOSPITAL_COMMUNITY): Payer: Self-pay | Admitting: Obstetrics and Gynecology

## 2019-10-02 DIAGNOSIS — O208 Other hemorrhage in early pregnancy: Secondary | ICD-10-CM | POA: Diagnosis not present

## 2019-10-02 DIAGNOSIS — Z3A01 Less than 8 weeks gestation of pregnancy: Secondary | ICD-10-CM | POA: Insufficient documentation

## 2019-10-02 DIAGNOSIS — O209 Hemorrhage in early pregnancy, unspecified: Secondary | ICD-10-CM

## 2019-10-02 DIAGNOSIS — O418X10 Other specified disorders of amniotic fluid and membranes, first trimester, not applicable or unspecified: Secondary | ICD-10-CM

## 2019-10-02 DIAGNOSIS — O210 Mild hyperemesis gravidarum: Secondary | ICD-10-CM | POA: Diagnosis present

## 2019-10-02 DIAGNOSIS — Z3491 Encounter for supervision of normal pregnancy, unspecified, first trimester: Secondary | ICD-10-CM

## 2019-10-02 DIAGNOSIS — Z3A08 8 weeks gestation of pregnancy: Secondary | ICD-10-CM

## 2019-10-02 DIAGNOSIS — O468X1 Other antepartum hemorrhage, first trimester: Secondary | ICD-10-CM

## 2019-10-02 DIAGNOSIS — Z87891 Personal history of nicotine dependence: Secondary | ICD-10-CM | POA: Diagnosis not present

## 2019-10-02 DIAGNOSIS — O219 Vomiting of pregnancy, unspecified: Secondary | ICD-10-CM

## 2019-10-02 LAB — URINALYSIS, ROUTINE W REFLEX MICROSCOPIC
Bilirubin Urine: NEGATIVE
Glucose, UA: NEGATIVE mg/dL
Hgb urine dipstick: NEGATIVE
Ketones, ur: 80 mg/dL — AB
Leukocytes,Ua: NEGATIVE
Nitrite: NEGATIVE
Protein, ur: 30 mg/dL — AB
Specific Gravity, Urine: 1.033 — ABNORMAL HIGH (ref 1.005–1.030)
pH: 6 (ref 5.0–8.0)

## 2019-10-02 LAB — WET PREP, GENITAL
Sperm: NONE SEEN
Trich, Wet Prep: NONE SEEN
Yeast Wet Prep HPF POC: NONE SEEN

## 2019-10-02 LAB — CBC
HCT: 37.4 % (ref 36.0–46.0)
Hemoglobin: 12.7 g/dL (ref 12.0–15.0)
MCH: 28.5 pg (ref 26.0–34.0)
MCHC: 34 g/dL (ref 30.0–36.0)
MCV: 84 fL (ref 80.0–100.0)
Platelets: 208 10*3/uL (ref 150–400)
RBC: 4.45 MIL/uL (ref 3.87–5.11)
RDW: 12.9 % (ref 11.5–15.5)
WBC: 6.6 10*3/uL (ref 4.0–10.5)
nRBC: 0 % (ref 0.0–0.2)

## 2019-10-02 LAB — HCG, QUANTITATIVE, PREGNANCY: hCG, Beta Chain, Quant, S: 109515 m[IU]/mL — ABNORMAL HIGH (ref <5)

## 2019-10-02 LAB — POCT PREGNANCY, URINE: Preg Test, Ur: POSITIVE — AB

## 2019-10-02 MED ORDER — LACTATED RINGERS IV BOLUS
1000.0000 mL | Freq: Once | INTRAVENOUS | Status: AC
  Administered 2019-10-02: 1000 mL via INTRAVENOUS

## 2019-10-02 MED ORDER — METOCLOPRAMIDE HCL 10 MG PO TABS: 10.0000 mg | ORAL_TABLET | Freq: Three times a day (TID) | ORAL | 0 refills | Status: DC | PRN

## 2019-10-02 MED ORDER — METRONIDAZOLE 500 MG PO TABS: 500.0000 mg | ORAL_TABLET | Freq: Two times a day (BID) | ORAL | 0 refills | Status: DC

## 2019-10-02 MED ORDER — SCOPOLAMINE 1 MG/3DAYS TD PT72
1.0000 | MEDICATED_PATCH | Freq: Once | TRANSDERMAL | Status: DC
  Administered 2019-10-02: 1.5 mg via TRANSDERMAL
  Filled 2019-10-02: qty 1

## 2019-10-02 MED ORDER — FAMOTIDINE IN NACL 20-0.9 MG/50ML-% IV SOLN
20.0000 mg | Freq: Once | INTRAVENOUS | Status: AC
  Administered 2019-10-02: 20 mg via INTRAVENOUS
  Filled 2019-10-02: qty 50

## 2019-10-02 MED ORDER — DIPHENHYDRAMINE HCL 50 MG/ML IJ SOLN
12.5000 mg | Freq: Once | INTRAMUSCULAR | Status: AC
  Administered 2019-10-02: 12.5 mg via INTRAVENOUS
  Filled 2019-10-02: qty 1

## 2019-10-02 MED ORDER — PROMETHAZINE HCL 25 MG/ML IJ SOLN
25.0000 mg | Freq: Once | INTRAMUSCULAR | Status: AC
  Administered 2019-10-02: 25 mg via INTRAVENOUS
  Filled 2019-10-02: qty 1

## 2019-10-02 NOTE — MAU Note (Signed)
.   Allison Hamilton is a 24 y.o. at [redacted]w[redacted]d here in MAU reporting: positive home pregnancy test last week with on and off vaginal spotting since around [place]. Abdominal pain at times that comes and goes. Denies any pain at present.  LMP: 08/04/19  Onset of complaint: around christmas Pain score: 0 Vitals:   10/02/19 1435  BP: 133/68  Pulse: 87  Resp: 16  Temp: 98.3 F (36.8 C)  SpO2: 100%     FHT: Lab orders placed from triage: UA/UPT

## 2019-10-02 NOTE — Discharge Instructions (Signed)
Subchorionic Hematoma  A subchorionic hematoma is a gathering of blood between the outer wall of the embryo (chorion) and the inner wall of the womb (uterus). This condition can cause vaginal bleeding. If they cause little or no vaginal bleeding, early small hematomas usually shrink on their own and do not affect your baby or pregnancy. When bleeding starts later in pregnancy, or if the hematoma is larger or occurs in older pregnant women, the condition may be more serious. Larger hematomas may get bigger, which increases the chances of miscarriage. This condition also increases the risk of:  Premature separation of the placenta from the uterus.  Premature (preterm) labor.  Stillbirth. What are the causes? The exact cause of this condition is not known. It occurs when blood is trapped between the placenta and the uterine wall because the placenta has separated from the original site of implantation. What increases the risk? You are more likely to develop this condition if:  You were treated with fertility medicines.  You conceived through in vitro fertilization (IVF). What are the signs or symptoms? Symptoms of this condition include:  Vaginal spotting or bleeding.  Contractions of the uterus. These cause abdominal pain. Sometimes you may have no symptoms and the bleeding may only be seen when ultrasound images are taken (transvaginal ultrasound). How is this diagnosed? This condition is diagnosed based on a physical exam. This includes a pelvic exam. You may also have other tests, including:  Blood tests.  Urine tests.  Ultrasound of the abdomen. How is this treated? Treatment for this condition can vary. Treatment may include:  Watchful waiting. You will be monitored closely for any changes in bleeding. During this stage: ? The hematoma may be reabsorbed by the body. ? The hematoma may separate the fluid-filled space containing the embryo (gestational sac) from the wall of the  womb (endometrium).  Medicines.  Activity restriction. This may be needed until the bleeding stops. Follow these instructions at home:  Stay on bed rest if told to do so by your health care provider.  Do not lift anything that is heavier than 10 lbs. (4.5 kg) or as told by your health care provider.  Do not use any products that contain nicotine or tobacco, such as cigarettes and e-cigarettes. If you need help quitting, ask your health care provider.  Track and write down the number of pads you use each day and how soaked (saturated) they are.  Do not use tampons.  Keep all follow-up visits as told by your health care provider. This is important. Your health care provider may ask you to have follow-up blood tests or ultrasound tests or both. Contact a health care provider if:  You have any vaginal bleeding.  You have a fever. Get help right away if:  You have severe cramps in your stomach, back, abdomen, or pelvis.  You pass large clots or tissue. Save any tissue for your health care provider to look at.  You have more vaginal bleeding, and you faint or become lightheaded or weak. Summary  A subchorionic hematoma is a gathering of blood between the outer wall of the placenta and the uterus.  This condition can cause vaginal bleeding.  Sometimes you may have no symptoms and the bleeding may only be seen when ultrasound images are taken.  Treatment may include watchful waiting, medicines, or activity restriction. This information is not intended to replace advice given to you by your health care provider. Make sure you discuss any questions you   have with your health care provider. Document Revised: 08/18/2017 Document Reviewed: 11/01/2016 Elsevier Patient Education  2020 [employer] Morning Sickness  Morning sickness is when a woman feels nauseous during pregnancy. This nauseous feeling may or may not come with vomiting. It often occurs in the morning, but it can be a  problem at any time of day. Morning sickness is most common during the first trimester. In some cases, it may continue throughout pregnancy. Although morning sickness is unpleasant, it is usually harmless unless the woman develops severe and continual vomiting (hyperemesis gravidarum), a condition that requires more intense treatment. What are the causes? The exact cause of this condition is not known, but it seems to be related to normal hormonal changes that occur in pregnancy. What increases the risk? You are more likely to develop this condition if:  You experienced nausea or vomiting before your pregnancy.  You had morning sickness during a previous pregnancy.  You are pregnant with more than one baby, such as twins. What are the signs or symptoms? Symptoms of this condition include:  Nausea.  Vomiting. How is this diagnosed? This condition is usually diagnosed based on your signs and symptoms. How is this treated? In many cases, treatment is not needed for this condition. Making some changes to what you eat may help to control symptoms. Your health care provider may also prescribe or recommend:  Vitamin B6 supplements.  Anti-nausea medicines.  Ginger. Follow these instructions at home: Medicines  Take over-the-counter and prescription medicines only as told by your health care provider. Do not use any prescription, over-the-counter, or herbal medicines for morning sickness without first talking with your health care provider.  Taking multivitamins before getting pregnant can prevent or decrease the severity of morning sickness in most women. Eating and drinking  Eat a piece of dry toast or crackers before getting out of bed in the morning.  Eat 5 or 6 small meals a day.  Eat dry and bland foods, such as rice or a baked potato. Foods that are high in carbohydrates are often helpful.  Avoid greasy, fatty, and spicy foods.  Have someone cook for you if the smell of any  food causes nausea and vomiting.  If you feel nauseous after taking prenatal vitamins, take the vitamins at night or with a snack.  Snack on protein foods between meals if you are hungry. Nuts, yogurt, and cheese are good options.  Drink fluids throughout the day.  Try ginger ale made with real ginger, ginger tea made from fresh grated ginger, or ginger candies. General instructions  Do not use any products that contain nicotine or tobacco, such as cigarettes and e-cigarettes. If you need help quitting, ask your health care provider.  Get an air purifier to keep the air in your house free of odors.  Get plenty of fresh air.  Try to avoid odors that trigger your nausea.  Consider trying these methods to help relieve symptoms: ? Wearing an acupressure wristband. These wristbands are often worn for seasickness. ? Acupuncture. Contact a health care provider if:  Your home remedies are not working and you need medicine.  You feel dizzy or light-headed.  You are losing weight. Get help right away if:  You have persistent and uncontrolled nausea and vomiting.  You faint.  You have severe pain in your abdomen. Summary  Morning sickness is when a woman feels nauseous during pregnancy. This nauseous feeling may or may not come with vomiting.  Morning sickness is  most common during the first trimester.  It often occurs in the morning, but it can be a problem at any time of day.  In many cases, treatment is not needed for this condition. Making some changes to what you eat may help to control symptoms. This information is not intended to replace advice given to you by your health care provider. Make sure you discuss any questions you have with your health care provider. Document Revised: 08/18/2017 Document Reviewed: 10/08/2016 Elsevier Patient Education  2020 [employer].

## 2019-10-02 NOTE — [facility] Provider Note (Signed)
Chief Complaint: Possible Pregnancy, Morning Sickness, and Vaginal Bleeding   First Provider Initiated Contact with Patient 10/02/19 1456     SUBJECTIVE HPI: Allison Hamilton is a 24 y.o. G3P1011 at [redacted]w[redacted]d who presents to [facility] reporting nausea/vomiting, abdominal cramping, and vaginal bleeding.  Reports intermittent vaginal spotting since Christmas day. Bleeding alternates from pink to brown. Is not saturating pads or passing blood clots. Also has had some intermittent lower abdominal pain. Endorses thick malodorous discharge. No itching.  Vomits 4 times per day. Took a nausea pill her mom gave her but states it only made her sleepy.   Location: abdoman Quality: cramping Severity: currently0/10 on pain scale Duration: 3 weeks Timing: intermittent Modifying factors: none Associated signs and symptoms: vaginal bleeding, n/v  Past Medical History:  Diagnosis Date  . Headache    OB History  Gravida Para Term Preterm AB Living  3 1 1   1 1   SAB TAB Ectopic Multiple Live Births  1 0   0 1    # Outcome Date GA Lbr Len/2nd Weight Sex Delivery Anes PTL Lv  3 Current           2 Term 03/15/18 [redacted]w[redacted]d / 00:29 3530 g M Vag-Spont EPI  LIV  1 SAB 08/2015             Complications: Pregnancy, twins   Past Surgical History:  Procedure Laterality Date  . NO PAST SURGERIES     Social History   Socioeconomic History  . Marital status: Single    Spouse name: Not on file  . Number of children: Not on file  . Years of education: Not on file  . Highest education level: Not on file  Occupational History  . Not on file  Tobacco Use  . Smoking status: Former Smoker    Types: Cigarettes    Quit date: 07/2017    Years since quitting: 2.2  . Smokeless tobacco: Never Used  Substance and Sexual Activity  . Alcohol use: No  . Drug use: No  . Sexual activity: Yes    Partners: Male    Birth control/protection: None  Other Topics Concern  . Not on file  Social History Narrative   . Not on file   Social Determinants of Health   Financial Resource Strain:   . Difficulty of Paying Living Expenses: Not on file  Food Insecurity:   . Worried About 08/2017 in the Last Year: Not on file  . Ran Out of Food in the Last Year: Not on file  Transportation Needs:   . Lack of Transportation (Medical): Not on file  . Lack of Transportation (Non-Medical): Not on file  Physical Activity:   . Days of Exercise per Week: Not on file  . Minutes of Exercise per Session: Not on file  Stress:   . Feeling of Stress : Not on file  Social Connections:   . Frequency of Communication with Friends and Family: Not on file  . Frequency of Social Gatherings with Friends and Family: Not on file  . Attends Religious Services: Not on file  . Active Member of Clubs or Organizations: Not on file  . Attends Programme researcher, broadcasting/film/video Meetings: Not on file  . Marital Status: Not on file  Intimate Partner Violence:   . Fear of Current or Ex-Partner: Not on file  . Emotionally Abused: Not on file  . Physically Abused: Not on file  . Sexually Abused: Not on file  History reviewed. No pertinent family history. No current facility-administered medications on file prior to encounter.   Current Outpatient Medications on File Prior to Encounter  Medication Sig Dispense Refill  . acetaminophen (TYLENOL) 500 MG tablet Take 500 mg by mouth every 6 (six) hours as needed for mild pain.     . diphenhydramine-acetaminophen (TYLENOL PM) 25-500 MG TABS tablet Take 2 tablets by mouth at bedtime as needed (sleep).    Marland Kitchen ibuprofen (ADVIL,MOTRIN) 600 MG tablet Take 1 tablet (600 mg total) by mouth every 6 (six) hours. (Patient not taking: Reported on 05/17/2018) 30 tablet 0  . ibuprofen (ADVIL,MOTRIN) 800 MG tablet Take 1 tablet (800 mg total) by mouth every 8 (eight) hours as needed. 30 tablet 5  . medroxyPROGESTERone (DEPO-PROVERA) 150 MG/ML injection Inject 1 mL (150 mg total) into the muscle every 3  (three) months. (Patient not taking: Reported on 05/17/2018) 1 mL 4  . omeprazole (PRILOSEC) 20 MG capsule Take 1 capsule (20 mg total) by mouth 2 (two) times daily before a meal. (Patient not taking: Reported on 05/17/2018) 60 capsule 5  . Prenatal-Fe Fum-Methf-FA w/o A (VITAFOL-NANO) 18-0.6-0.4 MG TABS Take 1 tablet by mouth daily. (Patient not taking: Reported on 05/17/2018) 30 tablet 12  . Vitamin D, Ergocalciferol, (DRISDOL) 50000 units CAPS capsule Take 1 capsule (50,000 Units total) by mouth every 7 (seven) days. (Patient not taking: Reported on 02/09/2018) 30 capsule 2   No Known Allergies  I have reviewed patient's Past Medical Hx, Surgical Hx, Family Hx, Social Hx, medications and allergies.   Review of Systems  Constitutional: Negative.   Gastrointestinal: Positive for abdominal pain, nausea and vomiting. Negative for constipation and diarrhea.  Genitourinary: Positive for vaginal bleeding and vaginal discharge. Negative for dysuria.    OBJECTIVE Patient Vitals for the past 24 hrs:  BP Temp Pulse Resp SpO2 Height Weight  10/02/19 1435 133/68 98.3 F (36.8 C) 87 16 100 % 5\' 3"  (1.6 m) 77.1 kg   Constitutional: Well-developed, well-nourished female in no acute distress.  Cardiovascular: normal rate & rhythm, no murmur Respiratory: normal rate and effort. Lung sounds clear throughout GI: Abd soft, non-tender, Pos BS x 4. No guarding or rebound tenderness MS: Extremities nontender, no edema, normal ROM Neurologic: Alert and oriented x 4.  GU:     SPECULUM EXAM: NEFG, small amount of thin, tan, foul smelling discharge. No blood.   BIMANUAL: No CMT. cervix closed; uterus normal size, no adnexal tenderness or masses.    LAB RESULTS Results for orders placed or performed during the hospital encounter of 10/02/19 (from the past 24 hour(s))  Pregnancy, urine POC     Status: Abnormal   Collection Time: 10/02/19  2:32 PM  Result Value Ref Range   Preg Test, Ur POSITIVE (A) NEGATIVE   CBC     Status: None   Collection Time: 10/02/19  3:12 PM  Result Value Ref Range   WBC 6.6 4.0 - 10.5 K/uL   RBC 4.45 3.87 - 5.11 MIL/uL   Hemoglobin 12.7 12.0 - 15.0 g/dL   HCT 37.4 36.0 - 46.0 %   MCV 84.0 80.0 - 100.0 fL   MCH 28.5 26.0 - 34.0 pg   MCHC 34.0 30.0 - 36.0 g/dL   RDW 12.9 11.5 - 15.5 %   Platelets 208 150 - 400 K/uL   nRBC 0.0 0.0 - 0.2 %  hCG, quantitative, pregnancy     Status: Abnormal   Collection Time: 10/02/19  3:12 PM  Result  Value Ref Range   hCG, Beta Chain, Quant, S 109,515 (H) <5 mIU/mL  Urinalysis, Routine w reflex microscopic     Status: Abnormal   Collection Time: 10/02/19  3:22 PM  Result Value Ref Range   Color, Urine YELLOW YELLOW   APPearance HAZY (A) CLEAR   Specific Gravity, Urine 1.033 (H) 1.005 - 1.030   pH 6.0 5.0 - 8.0   Glucose, UA NEGATIVE NEGATIVE mg/dL   Hgb urine dipstick NEGATIVE NEGATIVE   Bilirubin Urine NEGATIVE NEGATIVE   Ketones, ur 80 (A) NEGATIVE mg/dL   Protein, ur 30 (A) NEGATIVE mg/dL   Nitrite NEGATIVE NEGATIVE   Leukocytes,Ua NEGATIVE NEGATIVE   RBC / HPF 0-5 0 - 5 RBC/hpf   WBC, UA 0-5 0 - 5 WBC/hpf   Bacteria, UA RARE (A) NONE SEEN   Squamous Epithelial / LPF 0-5 0 - 5   Mucus PRESENT   Wet prep, genital     Status: Abnormal   Collection Time: 10/02/19  3:48 PM   Specimen: PATH Cytology Cervicovaginal Ancillary Only  Result Value Ref Range   Yeast Wet Prep HPF POC NONE SEEN NONE SEEN   Trich, Wet Prep NONE SEEN NONE SEEN   Clue Cells Wet Prep HPF POC PRESENT (A) NONE SEEN   WBC, Wet Prep HPF POC FEW (A) NONE SEEN   Sperm NONE SEEN     IMAGING US OB Comp Less 14 Wks  Result Date: 10/02/2019 CLINICAL DATA:  24 year old pregnant female with vaginal bleeding. LMP: 08/04/2019 corresponding to an estimated gestational age of [redacted] weeks, 3 days. EXAM: OBSTETRIC <14 WK ULTRASOUND TECHNIQUE: Transabdominal ultrasound was performed for evaluation of the gestation as well as the maternal uterus and adnexal regions.  COMPARISON:  None. FINDINGS: Intrauterine gestational sac: Single intrauterine gestational sac Yolk sac:  Not seen Embryo:  Present Cardiac Activity: Detect Heart Rate: 177 bpm CRL: 24 mm   9 w 0 d                  [place] EDC: 05/06/2020 Subchorionic hemorrhage: Small subchorionic hemorrhage measuring approximately 1.9 x 1.0 x 2.2 cm superior to the gestational sac. Maternal uterus/adnexae: The maternal ovaries are unremarkable. There is a corpus luteum cyst in the right ovary measuring approximately 3 cm. IMPRESSION: 1. Single live intrauterine pregnancy with an estimated gestational age of [redacted] weeks, 0 days. 2. Small subchorionic hemorrhage. Electronically Signed   By: Elgie Collard M.D.   On: 10/02/2019 16:37    MAU COURSE Orders Placed This Encounter  Procedures  . Wet prep, genital  . US OB Comp Less 14 Wks  . Urinalysis, Routine w reflex microscopic  . CBC  . hCG, quantitative, pregnancy  . Pregnancy, urine POC  . Discharge patient   Meds ordered this encounter  Medications  . lactated ringers bolus 1,000 mL  . famotidine (PEPCID) IVPB 20 mg premix  . promethazine (PHENERGAN) injection 25 mg  . scopolamine (TRANSDERM-SCOP) 1 MG/3DAYS 1.5 mg  . diphenhydrAMINE (BENADRYL) injection 12.5 mg  . metoCLOPramide (REGLAN) 10 MG tablet    Sig: Take 1 tablet (10 mg total) by mouth every 8 (eight) hours as needed for nausea.    Dispense:  30 tablet    Refill:  0    Order Specific Question:   Supervising Provider    Answer:   Eton Bing [id number]  . metroNIDAZOLE (FLAGYL) 500 MG tablet    Sig: Take 1 tablet (500 mg total) by mouth 2 (two) times daily.  Dispense:  14 tablet    Refill:  0    Order Specific Question:   Supervising Provider    Answer:   [place] Bing [[id number]]    MDM +UPT UA, wet prep, GC/chlamydia, CBC, ABO/Rh, quant hCG, and [place] today to rule out ectopic pregnancy which can be life threatening.   RH positive  Ultrasound shows live intrauterine pregnancy with  small subchorionic hemorrhage.   U/a shows >80 ketones & high specific gravity. IV fluids ordered. Given pepcid 20 mg & phenergan 25 mg IV.   Wet prep + clue cells. Vaginal discharge on exam consistent with bacterial vaginosis diagnosis.   ASSESSMENT 1. Subchorionic hematoma in first trimester, single or unspecified fetus   2. Vaginal bleeding in pregnancy, first trimester   3. Normal IUP (intrauterine pregnancy) on prenatal ultrasound, first trimester   4. [redacted] weeks gestation of pregnancy   5. Nausea and vomiting during pregnancy prior to [redacted] weeks gestation     PLAN Discharge home in stable condition. Discussed reasons to return to [facility] Rx flagyl & reglan GC/CT pending Start prenatal care  Allergies as of 10/02/2019   No Known Allergies     Medication List    STOP taking these medications   ibuprofen 600 MG tablet Commonly known as: ADVIL   ibuprofen 800 MG tablet Commonly known as: ADVIL   medroxyPROGESTERone 150 MG/ML injection Commonly known as: DEPO-PROVERA   omeprazole 20 MG capsule Commonly known as: PriLOSEC   Vitafol-Nano 18-0.6-0.4 MG Tabs   Vitamin D (Ergocalciferol) 1.25 MG (50000 UNIT) Caps capsule Commonly known as: DRISDOL     TAKE these medications   acetaminophen 500 MG tablet Commonly known as: TYLENOL Take 500 mg by mouth every 6 (six) hours as needed for mild pain.   diphenhydramine-acetaminophen 25-500 MG Tabs tablet Commonly known as: TYLENOL PM Take 2 tablets by mouth at bedtime as needed (sleep).   metoCLOPramide 10 MG tablet Commonly known as: REGLAN Take 1 tablet (10 mg total) by mouth every 8 (eight) hours as needed for nausea.   metroNIDAZOLE 500 MG tablet Commonly known as: FLAGYL Take 1 tablet (500 mg total) by mouth 2 (two) times daily.        Judeth Horn, NP 10/02/2019  6:19 PM

## 2019-10-03 LAB — GC/CHLAMYDIA PROBE AMP (~~LOC~~) NOT AT ARMC
Chlamydia: NEGATIVE
Comment: NEGATIVE
Comment: NORMAL
Neisseria Gonorrhea: NEGATIVE

## 2019-10-15 ENCOUNTER — Encounter (HOSPITAL_COMMUNITY): Payer: Self-pay | Admitting: Obstetrics & Gynecology

## 2019-10-15 ENCOUNTER — Inpatient Hospital Stay (HOSPITAL_COMMUNITY)
Admission: AD | Admit: 2019-10-15 | Discharge: 2019-10-15 | Disposition: A | Discharge status: Home / Self Care | Payer: Medicaid Other | Attending: Obstetrics & Gynecology | Admitting: Obstetrics & Gynecology

## 2019-10-15 ENCOUNTER — Other Ambulatory Visit: Payer: Self-pay

## 2019-10-15 DIAGNOSIS — Z3A10 10 weeks gestation of pregnancy: Secondary | ICD-10-CM | POA: Insufficient documentation

## 2019-10-15 DIAGNOSIS — Z87891 Personal history of nicotine dependence: Secondary | ICD-10-CM | POA: Insufficient documentation

## 2019-10-15 DIAGNOSIS — O219 Vomiting of pregnancy, unspecified: Secondary | ICD-10-CM

## 2019-10-15 DIAGNOSIS — O210 Mild hyperemesis gravidarum: Secondary | ICD-10-CM | POA: Diagnosis not present

## 2019-10-15 LAB — URINALYSIS, ROUTINE W REFLEX MICROSCOPIC
Bilirubin Urine: NEGATIVE
Glucose, UA: NEGATIVE mg/dL
Hgb urine dipstick: NEGATIVE
Ketones, ur: 80 mg/dL — AB
Nitrite: NEGATIVE
Protein, ur: 100 mg/dL — AB
Specific Gravity, Urine: 1.026 (ref 1.005–1.030)
pH: 6 (ref 5.0–8.0)

## 2019-10-15 LAB — BASIC METABOLIC PANEL
Anion gap: 14 (ref 5–15)
BUN: 7 mg/dL (ref 6–20)
CO2: 23 mmol/L (ref 22–32)
Calcium: 9.5 mg/dL (ref 8.9–10.3)
Chloride: 98 mmol/L (ref 98–111)
Creatinine, Ser: 0.77 mg/dL (ref 0.44–1.00)
GFR calc Af Amer: >60 mL/min (ref >60)
GFR calc non Af Amer: >60 mL/min (ref >60)
Glucose, Bld: 80 mg/dL (ref 70–99)
Potassium: 3.8 mmol/L (ref 3.5–5.1)
Sodium: 135 mmol/L (ref 135–145)

## 2019-10-15 MED ORDER — PROMETHAZINE HCL 25 MG/ML IJ SOLN
25.0000 mg | Freq: Once | INTRAMUSCULAR | Status: AC
  Administered 2019-10-15: 10:00:00 25 mg via INTRAVENOUS
  Filled 2019-10-15: qty 1

## 2019-10-15 MED ORDER — FAMOTIDINE IN NACL 20-0.9 MG/50ML-% IV SOLN
20.0000 mg | Freq: Once | INTRAVENOUS | Status: AC
  Administered 2019-10-15: 20 mg via INTRAVENOUS
  Filled 2019-10-15: qty 50

## 2019-10-15 MED ORDER — TERCONAZOLE 80 MG VA SUPP: 80.0000 mg | Freq: Every day | VAGINAL | 0 refills | Status: DC

## 2019-10-15 MED ORDER — PROMETHAZINE HCL 25 MG PO TABS: 25.0000 mg | ORAL_TABLET | Freq: Four times a day (QID) | ORAL | 0 refills | Status: DC | PRN

## 2019-10-15 MED ORDER — ONDANSETRON 4 MG PO TBDP: 4.0000 mg | ORAL_TABLET | Freq: Three times a day (TID) | ORAL | 0 refills | Status: DC | PRN

## 2019-10-15 MED ORDER — LACTATED RINGERS IV BOLUS
1000.0000 mL | Freq: Once | INTRAVENOUS | Status: AC
  Administered 2019-10-15: 1000 mL via INTRAVENOUS

## 2019-10-15 MED ORDER — THIAMINE HCL 100 MG/ML IJ SOLN
Freq: Once | INTRAVENOUS | Status: AC
  Filled 2019-10-15: qty 1000

## 2019-10-15 NOTE — [facility] Note (Signed)
Pt reports being unable to keep food down for 2 weeks. Constant emesis. Lack of appetite. Feels weak.

## 2019-10-15 NOTE — MAU Provider Note (Signed)
Chief Complaint: Emesis   First Provider Initiated Contact with Patient 10/15/19 [phone]     SUBJECTIVE HPI: Allison Hamilton is a 24 y.o. G3P1011 at [redacted]w[redacted]d who presents to Maternity Admissions reporting nausea & vomiting. States has had trouble keeping down anything for the last 2 weeks. Was seen in MAU on 1/13 & was prescribed reglan. States she has been taking it with minimal relief. Last took reglan on Sunday. States she vomits whenever she tries to eat or even when she turns over in bed. Makes her feel weak. Denies abdominal pain, fever, vaginal bleeding. Last BM was a few days ago; states she's not going regularly, which she attributes to her low intake.    Past Medical History:  Diagnosis Date  . Headache    OB History  Gravida Para Term Preterm AB Living  3 1 1   1 1   SAB TAB Ectopic Multiple Live Births  1 0   0 1    # Outcome Date GA Lbr Len/2nd Weight Sex Delivery Anes PTL Lv  3 Current           2 Term 03/15/18 [redacted]w[redacted]d / 00:29 3530 g M Vag-Spont EPI  LIV  1 SAB 08/2015             Complications: Pregnancy, twins   Past Surgical History:  Procedure Laterality Date  . NO PAST SURGERIES     Social History   Socioeconomic History  . Marital status: Single    Spouse name: Not on file  . Number of children: Not on file  . Years of education: Not on file  . Highest education level: Not on file  Occupational History  . Not on file  Tobacco Use  . Smoking status: Former Smoker    Types: Cigarettes    Quit date: 07/2017    Years since quitting: 2.2  . Smokeless tobacco: Never Used  Substance and Sexual Activity  . Alcohol use: No  . Drug use: No  . Sexual activity: Yes    Partners: Male    Birth control/protection: None  Other Topics Concern  . Not on file  Social History Narrative  . Not on file   Social Determinants of Health   Financial Resource Strain:   . Difficulty of Paying Living Expenses: Not on file  Food Insecurity:   . Worried About 08/2017 in the Last Year: Not on file  . Ran Out of Food in the Last Year: Not on file  Transportation Needs:   . Lack of Transportation (Medical): Not on file  . Lack of Transportation (Non-Medical): Not on file  Physical Activity:   . Days of Exercise per Week: Not on file  . Minutes of Exercise per Session: Not on file  Stress:   . Feeling of Stress : Not on file  Social Connections:   . Frequency of Communication with Friends and Family: Not on file  . Frequency of Social Gatherings with Friends and Family: Not on file  . Attends Religious Services: Not on file  . Active Member of Clubs or Organizations: Not on file  . Attends Brewing technologist Meetings: Not on file  . Marital Status: Not on file  Intimate Partner Violence:   . Fear of Current or Ex-Partner: Not on file  . Emotionally Abused: Not on file  . Physically Abused: Not on file  . Sexually Abused: Not on file   No family history on file. No current facility-administered medications  on file prior to encounter.   Current Outpatient Medications on File Prior to Encounter  Medication Sig Dispense Refill  . metroNIDAZOLE (FLAGYL) 500 MG tablet Take 1 tablet (500 mg total) by mouth 2 (two) times daily. 14 tablet 0  . acetaminophen (TYLENOL) 500 MG tablet Take 500 mg by mouth every 6 (six) hours as needed for mild pain.     . diphenhydramine-acetaminophen (TYLENOL PM) 25-500 MG TABS tablet Take 2 tablets by mouth at bedtime as needed (sleep).     No Known Allergies  I have reviewed patient's Past Medical Hx, Surgical Hx, Family Hx, Social Hx, medications and allergies.   Review of Systems  Constitutional: Negative.   Gastrointestinal: Positive for nausea and vomiting. Negative for abdominal pain and diarrhea.  Genitourinary: Negative.     OBJECTIVE Patient Vitals for the past 24 hrs:  BP Temp Pulse Resp SpO2 Height Weight  10/15/19 1227 (!) 105/50 -- -- -- -- -- --  10/15/19 1119 (!) 106/54 -- 67 -- -- -- --   10/15/19 0751 130/76 98.3 F (36.8 C) 93 16 100 % 5' 3.5" (1.613 m) 74.4 kg   Constitutional: Well-developed, well-nourished female in no acute distress.  Cardiovascular: normal rate & rhythm, no murmur Respiratory: normal rate and effort. Lung sounds clear throughout GI: Abd soft, non-tender, Pos BS x 4. No guarding or rebound tenderness MS: Extremities nontender, no edema, normal ROM Neurologic: Alert and oriented x 4.      LAB RESULTS Results for orders placed or performed during the hospital encounter of 10/15/19 (from the past 24 hour(s))  Urinalysis, Routine w reflex microscopic     Status: Abnormal   Collection Time: 10/15/19  7:41 AM  Result Value Ref Range   Color, Urine YELLOW YELLOW   APPearance HAZY (A) CLEAR   Specific Gravity, Urine 1.026 1.005 - 1.030   pH 6.0 5.0 - 8.0   Glucose, UA NEGATIVE NEGATIVE mg/dL   Hgb urine dipstick NEGATIVE NEGATIVE   Bilirubin Urine NEGATIVE NEGATIVE   Ketones, ur 80 (A) NEGATIVE mg/dL   Protein, ur 100 (A) NEGATIVE mg/dL   Nitrite NEGATIVE NEGATIVE   Leukocytes,Ua LARGE (A) NEGATIVE   RBC / HPF 6-10 0 - 5 RBC/hpf   WBC, UA 6-10 0 - 5 WBC/hpf   Bacteria, UA RARE (A) NONE SEEN   Squamous Epithelial / LPF 0-5 0 - 5   Mucus PRESENT   Basic metabolic panel     Status: None   Collection Time: 10/15/19  9:33 AM  Result Value Ref Range   Sodium 135 135 - 145 mmol/L   Potassium 3.8 3.5 - 5.1 mmol/L   Chloride 98 98 - 111 mmol/L   CO2 23 22 - 32 mmol/L   Glucose, Bld 80 70 - 99 mg/dL   BUN 7 6 - 20 mg/dL   Creatinine, Ser 0.77 0.44 - 1.00 mg/dL   Calcium 9.5 8.9 - 10.3 mg/dL   GFR calc non Af Amer >60 >60 mL/min   GFR calc Af Amer >60 >60 mL/min   Anion gap 14 5 - 15    IMAGING No results found.  MAU COURSE Orders Placed This Encounter  Procedures  . Urinalysis, Routine w reflex microscopic  . Basic metabolic panel  . Discharge patient   Meds ordered this encounter  Medications  . famotidine (PEPCID) IVPB 20 mg premix   . promethazine (PHENERGAN) injection 25 mg  . lactated ringers bolus 1,000 mL  . sodium chloride 0.9 % 1,000  mL with thiamine 100 mg, folic acid 1 mg, multivitamins adult 10 mL infusion  . promethazine (PHENERGAN) 25 MG tablet    Sig: Take 1 tablet (25 mg total) by mouth every 6 (six) hours as needed for nausea or vomiting.    Dispense:  30 tablet    Refill:  0    Order Specific Question:   Supervising Provider    Answer:   Adam Phenix [[id number]]  . ondansetron (ZOFRAN ODT) 4 MG disintegrating tablet    Sig: Take 1 tablet (4 mg total) by mouth every 8 (eight) hours as needed for nausea or vomiting.    Dispense:  15 tablet    Refill:  0    Order Specific Question:   Supervising Provider    Answer:   Adam Phenix [[id number]]  . terconazole (TERAZOL 3) 80 MG vaginal suppository    Sig: Place 1 suppository (80 mg total) vaginally at bedtime.    Dispense:  3 suppository    Refill:  0    Order Specific Question:   Supervising Provider    Answer:   Adam Phenix [[id number]]    MDM No abdominal pain or vaginal bleeding  U/a with 80 of ketones & high specific gravity. IV fluids & meds ordered.  Pt given pepcid, phenergan, & 2 bags of fluids.  Reports improvement in symptoms & tolerated crackers.   Will d/c home with phenergan & zofran rx. Will discontinue reglan.   Also reports vaginal itching & thick discharge since taking antibiotics for BV. Will rx terazol.  ASSESSMENT 1. Nausea and vomiting during pregnancy prior to [redacted] weeks gestation   2. [redacted] weeks gestation of pregnancy     PLAN Discharge home in stable condition. Discussed reasons to return to MAU D/c reglan Rx phenergan, zofran, & terazol Start prenatal care  Allergies as of 10/15/2019   No Known Allergies     Medication List    STOP taking these medications   metoCLOPramide 10 MG tablet Commonly known as: REGLAN     TAKE these medications   acetaminophen 500 MG tablet Commonly known as: TYLENOL Take 500 mg by  mouth every 6 (six) hours as needed for mild pain.   diphenhydramine-acetaminophen 25-500 MG Tabs tablet Commonly known as: TYLENOL PM Take 2 tablets by mouth at bedtime as needed (sleep).   metroNIDAZOLE 500 MG tablet Commonly known as: FLAGYL Take 1 tablet (500 mg total) by mouth 2 (two) times daily.   ondansetron 4 MG disintegrating tablet Commonly known as: Zofran ODT Take 1 tablet (4 mg total) by mouth every 8 (eight) hours as needed for nausea or vomiting.   promethazine 25 MG tablet Commonly known as: PHENERGAN Take 1 tablet (25 mg total) by mouth every 6 (six) hours as needed for nausea or vomiting.   terconazole 80 MG vaginal suppository Commonly known as: TERAZOL 3 Place 1 suppository (80 mg total) vaginally at bedtime.        Judeth Horn, NP 10/15/2019  12:42 PM

## 2019-10-15 NOTE — Discharge Instructions (Signed)

## 2019-11-06 LAB — OB RESULTS CONSOLE GC/CHLAMYDIA
Chlamydia: NEGATIVE
Gonorrhea: NEGATIVE

## 2019-11-06 LAB — OB RESULTS CONSOLE RUBELLA ANTIBODY, IGM: Rubella: IMMUNE

## 2019-11-06 LAB — OB RESULTS CONSOLE ABO/RH: RH Type: POSITIVE

## 2019-11-06 LAB — OB RESULTS CONSOLE RPR: RPR: NONREACTIVE

## 2019-11-06 LAB — OB RESULTS CONSOLE HEPATITIS B SURFACE ANTIGEN: Hepatitis B Surface Ag: NEGATIVE

## 2019-11-06 LAB — OB RESULTS CONSOLE ANTIBODY SCREEN: Antibody Screen: NEGATIVE

## 2019-11-06 LAB — OB RESULTS CONSOLE HIV ANTIBODY (ROUTINE TESTING): HIV: NONREACTIVE

## 2020-04-13 LAB — OB RESULTS CONSOLE GBS: GBS: POSITIVE

## 2020-04-22 ENCOUNTER — Telehealth (HOSPITAL_COMMUNITY): Payer: Self-pay | Admitting: *Deleted

## 2020-04-22 ENCOUNTER — Encounter (HOSPITAL_COMMUNITY): Payer: Self-pay | Admitting: *Deleted

## 2020-04-22 NOTE — Telephone Encounter (Signed)
Preadmission screen  

## 2020-04-23 ENCOUNTER — Telehealth (HOSPITAL_COMMUNITY): Payer: Self-pay | Admitting: *Deleted

## 2020-04-23 ENCOUNTER — Inpatient Hospital Stay (HOSPITAL_COMMUNITY)
Admission: AD | Admit: 2020-04-23 | Discharge: 2020-04-24 | Disposition: A | Discharge status: Home / Self Care | Payer: Medicaid Other | Attending: Obstetrics and Gynecology | Admitting: Obstetrics and Gynecology

## 2020-04-23 DIAGNOSIS — O479 False labor, unspecified: Secondary | ICD-10-CM | POA: Insufficient documentation

## 2020-04-23 NOTE — MAU Note (Addendum)
Having a lot of pain and pressure in vagina and bottom. Unsure if I am having ctxs but just a lot of pressure. Denies VB. When I throw up I leak urine but think it is urine. Had vomiting first of pregnancy which improved but has come back last few wks. 3cm last sve. Pain is worse when I move, walk, turn over in bed

## 2020-04-24 ENCOUNTER — Encounter (HOSPITAL_COMMUNITY): Payer: Self-pay | Admitting: *Deleted

## 2020-04-24 ENCOUNTER — Encounter (HOSPITAL_COMMUNITY): Payer: Self-pay | Admitting: Obstetrics and Gynecology

## 2020-04-24 DIAGNOSIS — O479 False labor, unspecified: Secondary | ICD-10-CM | POA: Diagnosis not present

## 2020-04-24 LAB — URINALYSIS, ROUTINE W REFLEX MICROSCOPIC
Bilirubin Urine: NEGATIVE
Glucose, UA: NEGATIVE mg/dL
Hgb urine dipstick: NEGATIVE
Ketones, ur: 5 mg/dL — AB
Leukocytes,Ua: NEGATIVE
Nitrite: NEGATIVE
Protein, ur: 100 mg/dL — AB
Specific Gravity, Urine: 1.028 (ref 1.005–1.030)
pH: 5 (ref 5.0–8.0)

## 2020-04-24 NOTE — MAU Note (Signed)
PT SAYS SHE HAS LOWER ABD CRAMPS STARTED  1 WEEK AGO . VAGINAL PAIN - FEELS PRESSURE - LAST SEX-  1 WEEK AGO -  NO UNUSUAL D/C .

## 2020-04-24 NOTE — Telephone Encounter (Signed)
Preadmission screen  

## 2020-04-24 NOTE — Discharge Instructions (Signed)
Braxton Hicks Contractions °Contractions of the uterus can occur throughout pregnancy, but they are not always a sign that you are in labor. You may have practice contractions called Braxton Hicks contractions. These false labor contractions are sometimes confused with true labor. °What are Braxton Hicks contractions? °Braxton Hicks contractions are tightening movements that occur in the muscles of the uterus before labor. Unlike true labor contractions, these contractions do not result in opening (dilation) and thinning of the cervix. Toward the end of pregnancy (32-34 weeks), Braxton Hicks contractions can happen more often and may become stronger. These contractions are sometimes difficult to tell apart from true labor because they can be very uncomfortable. You should not feel embarrassed if you go to the hospital with false labor. °Sometimes, the only way to tell if you are in true labor is for your health care provider to look for changes in the cervix. The health care provider will do a physical exam and may monitor your contractions. If you are not in true labor, the exam should show that your cervix is not dilating and your water has not broken. °If there are no other health problems associated with your pregnancy, it is completely safe for you to be sent home with false labor. You may continue to have Braxton Hicks contractions until you go into true labor. °How to tell the difference between true labor and false labor °True labor °· Contractions last 30-70 seconds. °· Contractions become very regular. °· Discomfort is usually felt in the top of the uterus, and it spreads to the lower abdomen and low back. °· Contractions do not go away with walking. °· Contractions usually become more intense and increase in frequency. °· The cervix dilates and gets thinner. °False labor °· Contractions are usually shorter and not as strong as true labor contractions. °· Contractions are usually irregular. °· Contractions  are often felt in the front of the lower abdomen and in the groin. °· Contractions may go away when you walk around or change positions while lying down. °· Contractions get weaker and are shorter-lasting as time goes on. °· The cervix usually does not dilate or become thin. °Follow these instructions at home: ° °· Take over-the-counter and prescription medicines only as told by your health care provider. °· Keep up with your usual exercises and follow other instructions from your health care provider. °· Eat and drink lightly if you think you are going into labor. °· If Braxton Hicks contractions are making you uncomfortable: °? Change your position from lying down or resting to walking, or change from walking to resting. °? Sit and rest in a tub of warm water. °? Drink enough fluid to keep your urine pale yellow. Dehydration may cause these contractions. °? Do slow and deep breathing several times an hour. °· Keep all follow-up prenatal visits as told by your health care provider. This is important. °Contact a health care provider if: °· You have a fever. °· You have continuous pain in your abdomen. °Get help right away if: °· Your contractions become stronger, more regular, and closer together. °· You have fluid leaking or gushing from your vagina. °· You pass blood-tinged mucus (bloody show). °· You have bleeding from your vagina. °· You have low back pain that you never had before. °· You feel your baby’s head pushing down and causing pelvic pressure. °· Your baby is not moving inside you as much as it used to. °Summary °· Contractions that occur before labor are   called Braxton Hicks contractions, false labor, or practice contractions. °· Braxton Hicks contractions are usually shorter, weaker, farther apart, and less regular than true labor contractions. True labor contractions usually become progressively stronger and regular, and they become more frequent. °· Manage discomfort from Braxton Hicks contractions  by changing position, resting in a warm bath, drinking plenty of water, or practicing deep breathing. °This information is not intended to replace advice given to you by your health care provider. Make sure you discuss any questions you have with your health care provider. °Document Revised: 08/18/2017 Document Reviewed: 01/19/2017 °Elsevier Patient Education © 2020 [employer] ° °

## 2020-04-27 ENCOUNTER — Other Ambulatory Visit (HOSPITAL_COMMUNITY)
Admission: RE | Admit: 2020-04-27 | Discharge: 2020-04-27 | Disposition: A | Discharge status: Home / Self Care | Payer: Medicaid Other | Source: Ambulatory Visit | Attending: Obstetrics and Gynecology | Admitting: Obstetrics and Gynecology

## 2020-04-27 DIAGNOSIS — Z20822 Contact with and (suspected) exposure to covid-19: Secondary | ICD-10-CM | POA: Diagnosis not present

## 2020-04-27 DIAGNOSIS — Z01812 Encounter for preprocedural laboratory examination: Secondary | ICD-10-CM | POA: Insufficient documentation

## 2020-04-27 LAB — SARS CORONAVIRUS 2 (TAT 6-24 HRS): SARS Coronavirus 2: NEGATIVE

## 2020-04-28 ENCOUNTER — Other Ambulatory Visit: Payer: Self-pay | Admitting: Obstetrics and Gynecology

## 2020-04-29 ENCOUNTER — Encounter (HOSPITAL_COMMUNITY): Payer: Self-pay | Admitting: Obstetrics and Gynecology

## 2020-04-29 ENCOUNTER — Other Ambulatory Visit: Payer: Self-pay

## 2020-04-29 ENCOUNTER — Inpatient Hospital Stay (HOSPITAL_COMMUNITY): Payer: Medicaid Other | Admitting: Anesthesiology

## 2020-04-29 ENCOUNTER — Inpatient Hospital Stay (HOSPITAL_COMMUNITY)
Admission: AD | Admit: 2020-04-29 | Discharge: 2020-05-01 | DRG: 807 | Disposition: A | Discharge status: Home / Self Care | Payer: Medicaid Other | Attending: Obstetrics and Gynecology | Admitting: Obstetrics and Gynecology

## 2020-04-29 ENCOUNTER — Inpatient Hospital Stay (HOSPITAL_COMMUNITY): Payer: Medicaid Other

## 2020-04-29 DIAGNOSIS — O99214 Obesity complicating childbirth: Secondary | ICD-10-CM | POA: Diagnosis present

## 2020-04-29 DIAGNOSIS — Z87891 Personal history of nicotine dependence: Secondary | ICD-10-CM

## 2020-04-29 DIAGNOSIS — O26893 Other specified pregnancy related conditions, third trimester: Secondary | ICD-10-CM | POA: Diagnosis present

## 2020-04-29 DIAGNOSIS — Z3A39 39 weeks gestation of pregnancy: Secondary | ICD-10-CM | POA: Diagnosis not present

## 2020-04-29 DIAGNOSIS — E669 Obesity, unspecified: Secondary | ICD-10-CM | POA: Diagnosis present

## 2020-04-29 DIAGNOSIS — O99824 Streptococcus B carrier state complicating childbirth: Secondary | ICD-10-CM | POA: Diagnosis present

## 2020-04-29 LAB — TYPE AND SCREEN
ABO/RH(D): A POS
Antibody Screen: NEGATIVE

## 2020-04-29 LAB — CBC
HCT: 33.3 % — ABNORMAL LOW (ref 36.0–46.0)
Hemoglobin: 10.8 g/dL — ABNORMAL LOW (ref 12.0–15.0)
MCH: 28.1 pg (ref 26.0–34.0)
MCHC: 32.4 g/dL (ref 30.0–36.0)
MCV: 86.5 fL (ref 80.0–100.0)
Platelets: 219 10*3/uL (ref 150–400)
RBC: 3.85 MIL/uL — ABNORMAL LOW (ref 3.87–5.11)
RDW: 14.1 % (ref 11.5–15.5)
WBC: 8.6 10*3/uL (ref 4.0–10.5)
nRBC: 0 % (ref 0.0–0.2)

## 2020-04-29 MED ORDER — OXYTOCIN BOLUS FROM INFUSION
333.0000 mL | Freq: Once | INTRAVENOUS | Status: AC
  Administered 2020-04-30: 333 mL via INTRAVENOUS

## 2020-04-29 MED ORDER — OXYTOCIN-SODIUM CHLORIDE 30-0.9 UT/500ML-% IV SOLN
1.0000 m[IU]/min | INTRAVENOUS | Status: DC
  Administered 2020-04-29: 2 m[IU]/min via INTRAVENOUS
  Filled 2020-04-29: qty 500

## 2020-04-29 MED ORDER — ACETAMINOPHEN 325 MG PO TABS: 650.0000 mg | ORAL_TABLET | ORAL | Status: DC | PRN

## 2020-04-29 MED ORDER — SODIUM CHLORIDE (PF) 0.9 % IJ SOLN
INTRAMUSCULAR | Status: DC | PRN
  Administered 2020-04-29: 12 mL/h via EPIDURAL

## 2020-04-29 MED ORDER — LIDOCAINE HCL (PF) 1 % IJ SOLN
INTRAMUSCULAR | Status: DC | PRN
  Administered 2020-04-29 (×2): 4 mL via EPIDURAL

## 2020-04-29 MED ORDER — OXYCODONE-ACETAMINOPHEN 5-325 MG PO TABS: 1.0000 | ORAL_TABLET | ORAL | Status: DC | PRN

## 2020-04-29 MED ORDER — SODIUM CHLORIDE 0.9 % IV SOLN
5.0000 10*6.[IU] | Freq: Once | INTRAVENOUS | Status: AC
  Administered 2020-04-29: 5 10*6.[IU] via INTRAVENOUS
  Filled 2020-04-29: qty 5

## 2020-04-29 MED ORDER — ONDANSETRON HCL 4 MG/2ML IJ SOLN
4.0000 mg | Freq: Four times a day (QID) | INTRAMUSCULAR | Status: DC | PRN
  Administered 2020-04-29: 4 mg via INTRAVENOUS
  Filled 2020-04-29: qty 2

## 2020-04-29 MED ORDER — LIDOCAINE HCL (PF) 1 % IJ SOLN: 30.0000 mL | INTRAMUSCULAR | Status: DC | PRN

## 2020-04-29 MED ORDER — PHENYLEPHRINE 40 MCG/ML (10ML) SYRINGE FOR IV PUSH (FOR BLOOD PRESSURE SUPPORT): 80.0000 ug | PREFILLED_SYRINGE | INTRAVENOUS | Status: DC | PRN

## 2020-04-29 MED ORDER — TERBUTALINE SULFATE 1 MG/ML IJ SOLN: 0.2500 mg | Freq: Once | INTRAMUSCULAR | Status: DC | PRN

## 2020-04-29 MED ORDER — OXYCODONE-ACETAMINOPHEN 5-325 MG PO TABS: 2.0000 | ORAL_TABLET | ORAL | Status: DC | PRN

## 2020-04-29 MED ORDER — PENICILLIN G POT IN DEXTROSE 60000 UNIT/ML IV SOLN
3.0000 10*6.[IU] | INTRAVENOUS | Status: DC
  Administered 2020-04-29: 3 10*6.[IU] via INTRAVENOUS
  Filled 2020-04-29: qty 50

## 2020-04-29 MED ORDER — LACTATED RINGERS IV SOLN: INTRAVENOUS | Status: DC

## 2020-04-29 MED ORDER — OXYTOCIN-SODIUM CHLORIDE 30-0.9 UT/500ML-% IV SOLN: 2.5000 [IU]/h | INTRAVENOUS | Status: DC

## 2020-04-29 MED ORDER — DIPHENHYDRAMINE HCL 50 MG/ML IJ SOLN: 12.5000 mg | INTRAMUSCULAR | Status: DC | PRN

## 2020-04-29 MED ORDER — EPHEDRINE 5 MG/ML INJ: 10.0000 mg | INTRAVENOUS | Status: DC | PRN

## 2020-04-29 MED ORDER — LACTATED RINGERS IV SOLN: 500.0000 mL | INTRAVENOUS | Status: DC | PRN

## 2020-04-29 MED ORDER — FENTANYL-BUPIVACAINE-NACL 0.5-0.125-0.9 MG/250ML-% EP SOLN
12.0000 mL/h | EPIDURAL | Status: DC | PRN
  Filled 2020-04-29: qty 250

## 2020-04-29 MED ORDER — SOD CITRATE-CITRIC ACID 500-334 MG/5ML PO SOLN
30.0000 mL | ORAL | Status: DC | PRN
  Administered 2020-04-29: 30 mL via ORAL
  Filled 2020-04-29: qty 30

## 2020-04-29 MED ORDER — FLEET ENEMA 7-19 GM/118ML RE ENEM: 1.0000 | ENEMA | RECTAL | Status: DC | PRN

## 2020-04-29 MED ORDER — LACTATED RINGERS IV SOLN
500.0000 mL | Freq: Once | INTRAVENOUS | Status: AC
  Administered 2020-04-29: 500 mL via INTRAVENOUS

## 2020-04-29 NOTE — Anesthesia Procedure Notes (Signed)
Epidural Patient location during procedure: OB Start time: 04/29/2020 4:55 PM End time: 04/29/2020 5:03 PM  Staffing Anesthesiologist: Mal Amabile, MD Performed: anesthesiologist   Preanesthetic Checklist Completed: patient identified, IV checked, site marked, risks and benefits discussed, surgical consent, monitors and equipment checked, pre-op evaluation and timeout performed  Epidural Patient position: sitting Prep: DuraPrep and site prepped and draped Patient monitoring: continuous pulse ox and blood pressure Approach: midline Location: L3-L4 Injection technique: LOR air  Needle:  Needle type: Tuohy  Needle gauge: 17 G Needle length: 9 cm and 9 Needle insertion depth: 7 cm Catheter type: closed end flexible Catheter size: 19 Gauge Catheter at skin depth: 12 cm Test dose: negative and Other  Assessment Events: blood not aspirated, injection not painful, no injection resistance, no paresthesia and negative IV test  Additional Notes Patient identified. Risks and benefits discussed including failed block, incomplete  Pain control, post dural puncture headache, nerve damage, paralysis, blood pressure Changes, nausea, vomiting, reactions to medications-both toxic and allergic and post Partum back pain. All questions were answered. Patient expressed understanding and wished to proceed. Sterile technique was used throughout procedure. Epidural site was Dressed with sterile barrier dressing. No paresthesias, signs of intravascular injection Or signs of intrathecal spread were encountered.  Patient was more comfortable after the epidural was dosed. Please see RN's note for documentation of vital signs and FHR which are stable. Reason for block:procedure for pain

## 2020-04-29 NOTE — Progress Notes (Addendum)
Comfortable s/p epidural  BP (!) 95/46   Pulse 73   Temp 97.8 F (36.6 C) (Axillary)   Resp 16   Ht 5\' 4"  (1.626 m)   Wt 89.2 kg   LMP 08/04/2019   SpO2 100%   BMI 33.75 kg/m   Category 1 tracing, baseline 135, + accels, - decels TOCO q57m, pitocin at 80mU/min Clear AROM 2005, 5/70/-2  Continue to titrate pitocin per protocol. Anticipate SVD. On 2nd dose of PCN for GBS ppx

## 2020-04-29 NOTE — Anesthesia Preprocedure Evaluation (Signed)
Anesthesia Evaluation  Patient identified by MRN, date of birth, ID band Patient awake    Reviewed: Allergy & Precautions, Patient's Chart, lab work & pertinent test results  Airway Mallampati: II  TM Distance: >3 FB Neck ROM: Full    Dental no notable dental hx. (+) Teeth Intact   Pulmonary former smoker,    Pulmonary exam normal breath sounds clear to auscultation       Cardiovascular negative cardio ROS Normal cardiovascular exam Rhythm:Regular Rate:Normal     Neuro/Psych  Headaches, PSYCHIATRIC DISORDERS Depression    GI/Hepatic Neg liver ROS, GERD  Medicated and Controlled,  Endo/Other  Obesity  Renal/GU negative Renal ROS  negative genitourinary   Musculoskeletal negative musculoskeletal ROS (+)   Abdominal (+) + obese,   Peds  Hematology  (+) anemia ,   Anesthesia Other Findings   Reproductive/Obstetrics (+) Pregnancy                             Anesthesia Physical Anesthesia Plan  ASA: II  Anesthesia Plan: Spinal   Post-op Pain Management:    Induction:   PONV Risk Score and Plan:   Airway Management Planned: Natural Airway  Additional Equipment:   Intra-op Plan:   Post-operative Plan:   Informed Consent: I have reviewed the patients History and Physical, chart, labs and discussed the procedure including the risks, benefits and alternatives for the proposed anesthesia with the patient or authorized representative who has indicated his/her understanding and acceptance.       Plan Discussed with: Anesthesiologist  Anesthesia Plan Comments:         Anesthesia Quick Evaluation

## 2020-04-29 NOTE — Progress Notes (Addendum)
Patient w/ increased pressure. CE complete/0-+1, no strong urge to push/ High Fowlers, Cat 1 tracing. TOco q72m, 68mu/min. Will sit up ion High fowlers and allow to labor down

## 2020-04-29 NOTE — H&P (Signed)
Allison Hamilton is a 24 y.o. female presenting for scheduled IOL. +FM, denies VB, LOF. Irr contractions.  PNC s/f GBS pos (no PCN allergy). Sister w/ spinfa bifida, pt on folic acid and msAFP @ 16wks WNL OB History    Gravida  3   Para  1   Term  1   Preterm      AB  1   Living  1     SAB  1   TAB  0   Ectopic      Multiple  0   Live Births  1          Past Medical History:  Diagnosis Date  . Headache    Past Surgical History:  Procedure Laterality Date  . NO PAST SURGERIES     Family History: family history includes Spina bifida in her sister. Social History:  reports that she quit smoking about 2 years ago. Her smoking use included cigarettes. She has never used smokeless tobacco. She reports that she does not drink alcohol and does not use drugs.     Maternal Diabetes: No 1hr 102 Genetic Screening: Normal Maternal Ultrasounds/Referrals: Normal Fetal Ultrasounds or other Referrals:  None Maternal Substance Abuse:  No Significant Maternal Medications:  None Significant Maternal Lab Results:  Group B Strep positive Other Comments:  None  Review of Systems  Constitutional: Negative for chills and fever.  Respiratory: Negative for shortness of breath.   Cardiovascular: Negative for chest pain, palpitations and leg swelling.  Gastrointestinal: Positive for abdominal pain (occ ctx). Negative for vomiting.  Neurological: Negative for dizziness, weakness and headaches.  Psychiatric/Behavioral: Negative for suicidal ideas.   History Dilation: 3.5 Station: -2 Exam by:: Dr. Reina Fuse Blood pressure 123/75, pulse 86, temperature 98.4 F (36.9 C), temperature source Oral, resp. rate 16, last menstrual period 08/04/2019. Exam Physical Exam Constitutional:      General: She is not in acute distress.    Appearance: She is well-developed.  HENT:     Head: Normocephalic and atraumatic.  Eyes:     Pupils: Pupils are equal, round, and reactive to light.   Cardiovascular:     Rate and Rhythm: Normal rate and regular rhythm.     Heart sounds: No murmur heard.  No gallop.   Abdominal:     Tenderness: There is no abdominal tenderness. There is no guarding or rebound.  Genitourinary:    Vagina: Normal.  Musculoskeletal:        General: Normal range of motion.     Cervical back: Normal range of motion and neck supple.  Skin:    General: Skin is warm and dry.  Neurological:     Mental Status: She is alert and oriented to person, place, and time.     Prenatal labs: ABO, Rh: A/Positive/-- (02/17 0000) Antibody: Negative (02/17 0000) Rubella: Immune (02/17 0000) RPR: Nonreactive (02/17 0000)  HBsAg: Negative (02/17 0000)  HIV: Non-reactive (02/17 0000)  GBS: Positive/-- (07/26 0000)  Cat 1 tracing, irr toco   Assessment/Plan: This is a 24yo G3P1011 @ 14 0/7 by 9wk MAU scan admitted for IOL for favorable cervix at term, GBS pos -PCN for ppx -Plan for AROM after 2nd dose started, pitocin per protocol.    Valerie Roys Revere Maahs 04/29/2020, 4:07 PM

## 2020-04-30 ENCOUNTER — Encounter (HOSPITAL_COMMUNITY): Payer: Self-pay | Admitting: Obstetrics and Gynecology

## 2020-04-30 LAB — CBC
HCT: 31.2 % — ABNORMAL LOW (ref 36.0–46.0)
Hemoglobin: 10 g/dL — ABNORMAL LOW (ref 12.0–15.0)
MCH: 27.2 pg (ref 26.0–34.0)
MCHC: 32.1 g/dL (ref 30.0–36.0)
MCV: 85 fL (ref 80.0–100.0)
Platelets: 191 10*3/uL (ref 150–400)
RBC: 3.67 MIL/uL — ABNORMAL LOW (ref 3.87–5.11)
RDW: 13.8 % (ref 11.5–15.5)
WBC: 10.4 10*3/uL (ref 4.0–10.5)
nRBC: 0 % (ref 0.0–0.2)

## 2020-04-30 LAB — RPR: RPR Ser Ql: NONREACTIVE

## 2020-04-30 MED ORDER — ONDANSETRON HCL 4 MG/2ML IJ SOLN: 4.0000 mg | INTRAMUSCULAR | Status: DC | PRN

## 2020-04-30 MED ORDER — PRENATAL MULTIVITAMIN CH
1.0000 | ORAL_TABLET | Freq: Every day | ORAL | Status: DC
  Administered 2020-04-30 – 2020-05-01 (×2): 1 via ORAL
  Filled 2020-04-30 (×2): qty 1

## 2020-04-30 MED ORDER — DIPHENHYDRAMINE HCL 25 MG PO CAPS: 25.0000 mg | ORAL_CAPSULE | Freq: Four times a day (QID) | ORAL | Status: DC | PRN

## 2020-04-30 MED ORDER — COCONUT OIL OIL
1.0000 "application " | TOPICAL_OIL | Status: DC | PRN
  Administered 2020-04-30: 1 via TOPICAL

## 2020-04-30 MED ORDER — IBUPROFEN 600 MG PO TABS
600.0000 mg | ORAL_TABLET | Freq: Four times a day (QID) | ORAL | Status: DC
  Administered 2020-04-30 – 2020-05-01 (×6): 600 mg via ORAL
  Filled 2020-04-30 (×6): qty 1

## 2020-04-30 MED ORDER — SIMETHICONE 80 MG PO CHEW: 80.0000 mg | CHEWABLE_TABLET | ORAL | Status: DC | PRN

## 2020-04-30 MED ORDER — BENZOCAINE-MENTHOL 20-0.5 % EX AERO
1.0000 "application " | INHALATION_SPRAY | CUTANEOUS | Status: DC | PRN
  Administered 2020-04-30: 1 via TOPICAL
  Filled 2020-04-30 (×2): qty 56

## 2020-04-30 MED ORDER — TETANUS-DIPHTH-ACELL PERTUSSIS 5-2.5-18.5 LF-MCG/0.5 IM SUSP: 0.5000 mL | Freq: Once | INTRAMUSCULAR | Status: DC

## 2020-04-30 MED ORDER — ACETAMINOPHEN 325 MG PO TABS
650.0000 mg | ORAL_TABLET | ORAL | Status: DC | PRN
  Administered 2020-04-30 – 2020-05-01 (×4): 650 mg via ORAL
  Filled 2020-04-30 (×4): qty 2

## 2020-04-30 MED ORDER — ONDANSETRON HCL 4 MG PO TABS: 4.0000 mg | ORAL_TABLET | ORAL | Status: DC | PRN

## 2020-04-30 MED ORDER — DIBUCAINE (PERIANAL) 1 % EX OINT
1.0000 "application " | TOPICAL_OINTMENT | CUTANEOUS | Status: DC | PRN
  Filled 2020-04-30: qty 28

## 2020-04-30 MED ORDER — WITCH HAZEL-GLYCERIN EX PADS
1.0000 "application " | MEDICATED_PAD | CUTANEOUS | Status: DC | PRN
  Administered 2020-04-30: 1 via TOPICAL

## 2020-04-30 MED ORDER — ZOLPIDEM TARTRATE 5 MG PO TABS: 5.0000 mg | ORAL_TABLET | Freq: Every evening | ORAL | Status: DC | PRN

## 2020-04-30 MED ORDER — SENNOSIDES-DOCUSATE SODIUM 8.6-50 MG PO TABS
2.0000 | ORAL_TABLET | ORAL | Status: DC
  Administered 2020-05-01: 2 via ORAL
  Filled 2020-04-30: qty 2

## 2020-04-30 NOTE — Progress Notes (Signed)
PPD #0 No problems, sore bottom Afeb, VSS Fundus firm, NT at U-1 Continue routine postpartum care

## 2020-04-30 NOTE — Anesthesia Postprocedure Evaluation (Signed)
Anesthesia Post Note  Patient: Estate agent  Procedure(s) Performed: AN AD HOC LABOR EPIDURAL     Patient location during evaluation: PACU Anesthesia Type: Spinal Level of consciousness: oriented and awake and alert Pain management: pain level controlled Vital Signs Assessment: post-procedure vital signs reviewed and stable Respiratory status: spontaneous breathing, respiratory function stable and patient connected to nasal cannula oxygen Cardiovascular status: blood pressure returned to baseline and stable Postop Assessment: no headache, no backache and no apparent nausea or vomiting Anesthetic complications: no   No complications documented.  Last Vitals:  Vitals:   04/30/20 0215 04/30/20 0310  BP: 125/61 (!) 123/55  Pulse: 77 66  Resp: 18 17  Temp: 36.6 C 36.8 C  SpO2: 100% 100%    Last Pain:  Vitals:   04/30/20 0310  TempSrc: Oral  PainSc: 4    Pain Goal:                Epidural/Spinal Function Cutaneous sensation: Normal sensation (04/30/20 0630), Patient able to flex knees: Yes (04/30/20 0630), Patient able to lift hips off bed: Yes (04/30/20 0630), Back pain beyond tenderness at insertion site: No (04/30/20 0630), Progressively worsening motor and/or sensory loss: No (04/30/20 0630), Bowel and/or bladder incontinence post epidural: No (04/30/20 0630)  Marrion Coy

## 2020-04-30 NOTE — Lactation Note (Signed)
This note was copied from a baby's chart. Lactation Consultation Note Baby 23 hrs old. Mom stated baby isn't latching well so she wanted her to have some formula. Mom had trouble BF her 1st child d/t poor latching. Mom stated she pumped some and gave formula as well as BM for a little while.  Asked mom to call for [facility] assistance for next feeding. Mom stated ok.  Patient Name: Allison Hamilton [id number] Date: 04/30/2020     Maternal Data    Feeding Feeding Type: Formula Nipple Type: Slow - flow  LATCH Score                   Interventions    Lactation Tools Discussed/Used     Consult Status      [name] G 04/30/2020, 11:30 PM

## 2020-04-30 NOTE — Plan of Care (Signed)
L&D careplan completed 

## 2020-05-01 MED ORDER — IBUPROFEN 600 MG PO TABS: 600.0000 mg | ORAL_TABLET | Freq: Four times a day (QID) | ORAL | 1 refills | Status: DC | PRN

## 2020-05-01 NOTE — Discharge Instructions (Signed)
Call office with any concerns [phone] 

## 2020-05-01 NOTE — Progress Notes (Signed)
Post Partum Day 1 Subjective: no complaints, up ad lib, voiding, tolerating PO, + flatus and lochia mild. Pt denies HA, CP, SOB or fever. She is bonding well with baby - breast/bottlefeeding.She would like discharge to home today  Objective: Blood pressure 129/75, pulse 84, temperature (!) 97.4 F (36.3 C), temperature source Oral, resp. rate 17, height 5\' 4"  (1.626 m), weight 89.2 kg, last menstrual period 08/04/2019, SpO2 100 %, unknown if currently breastfeeding.  Physical Exam:  General: alert, cooperative and no distress Lochia: appropriate Uterine Fundus: firm Incision: n/a DVT Evaluation: No evidence of DVT seen on physical exam. Negative Homan's sign. No significant calf/ankle edema.  Recent Labs    04/29/20 1549 04/30/20 0644  HGB 10.8* 10.0*  HCT 33.3* 31.2*    Assessment/Plan: Discharge home and Breastfeeding   LOS: 2 days   Allison Hamilton W Barb Shear 05/01/2020, 9:38 AM

## 2020-05-01 NOTE — Discharge Summary (Signed)
Postpartum Discharge Summary  Date of Service updated      Patient Name: Allison Hamilton DOB: 09-15-1996 MRN: [id number]  Date of admission: 04/29/2020 Delivery date:04/30/2020  Delivering provider: Deliah Boston  Date of discharge: 05/01/2020  Admitting diagnosis: [redacted] weeks gestation of pregnancy [Z3A.39] Intrauterine pregnancy: [redacted]w[redacted]d    Secondary diagnosis:  Active Problems:   [redacted] weeks gestation of pregnancy  Additional problems: none    Discharge diagnosis: Term Pregnancy Delivered                                              Post partum procedures:n/a Augmentation: AROM and Pitocin Complications: None  Hospital course: Induction of Labor With Vaginal Delivery   24y.o. yo [id number] admitted to the hospital 04/29/2020 for induction of labor.  Indication for induction: Favorable cervix at term.  Patient had an uncomplicated labor course as follows: Membrane Rupture Time/Date: 8:05 PM ,04/29/2020   Delivery Method:Vaginal, Spontaneous  Episiotomy: Median  Lacerations:  2nd degree  Details of delivery can be found in separate delivery note.  Patient had a routine postpartum course. Patient is discharged home 05/01/20.  Newborn Data: Birth date:04/30/2020  Birth time:12:06 AM  Gender:Female  Living status:Living  Apgars:8 ,9  Weight:3175 g   Magnesium Sulfate received: No BMZ received: No Rhophylac:N/A MMR:N/A T-DaP:Given prenatally Flu: N/A Transfusion:No  Physical exam  Vitals:   04/30/20 0738 04/30/20 1641 04/30/20 2030 05/01/20 0610  BP: (!) 113/53 129/80 (!) 119/52 129/75  Pulse: 66 78 66 84  Resp: _0 Temp: 98.5 F (36.9 C) 98.2 F (36.8 C) 98 F (36.7 C) (!) 97.4 F (36.3 C)  TempSrc: Oral Oral Oral Oral  SpO2: 100%  98% 100%  Weight:      Height:       General: alert, cooperative and no distress Lochia: appropriate Uterine Fundus: firm Incision: N/A DVT Evaluation: No evidence of DVT seen on physical exam. Labs: Lab  Results  Component Value Date   WBC 10.4 04/30/2020   HGB 10.0 (L) 04/30/2020   HCT 31.2 (L) 04/30/2020   MCV 85.0 04/30/2020   PLT 191 04/30/2020   CMP Latest Ref Rng & Units 10/15/2019  Glucose 70 - 99 mg/dL 80  BUN 6 - 20 mg/dL 7  Creatinine 0.44 - 1.00 mg/dL 0.77  Sodium 135 - 145 mmol/L 135  Potassium 3.5 - 5.1 mmol/L 3.8  Chloride 98 - 111 mmol/L 98  CO2 22 - 32 mmol/L 23  Calcium 8.9 - 10.3 mg/dL 9.5  Total Protein 6.5 - 8.1 g/dL -  Total Bilirubin 0.3 - 1.2 mg/dL -  Alkaline Phos 38 - 126 U/L -  AST 15 - 41 U/L -  ALT 14 - 54 U/L -   Edinburgh Score: Edinburgh Postnatal Depression Scale Screening Tool 04/30/2020  I have been able to laugh and see the funny side of things. 0  I have looked forward with enjoyment to things. 0  I have blamed myself unnecessarily when things went wrong. 0  I have been anxious or worried for no good reason. 0  I have felt scared or panicky for no good reason. 0  Things have been getting on top of me. 0  I have been so unhappy that I have had difficulty sleeping. 0  I have felt sad or miserable.  0  I have been so unhappy that I have been crying. 0  The thought of harming myself has occurred to me. 0  Edinburgh Postnatal Depression Scale Total 0      After visit meds:  Allergies as of 05/01/2020   No Known Allergies     Medication List    TAKE these medications   ibuprofen 600 MG tablet Commonly known as: ADVIL Take 1 tablet (600 mg total) by mouth every 6 (six) hours as needed for moderate pain or cramping.        Discharge home in stable condition Infant Feeding: Breast Infant Disposition:home with mother Discharge instruction: per After Visit Summary and Postpartum booklet. Activity: Advance as tolerated. Pelvic rest for 6 weeks.  Diet: routine diet Anticipated Birth Control: Unsure Postpartum Appointment:6 weeks Additional Postpartum F/U: n/a Future Appointments:No future appointments. Follow up Visit:  Follow-up  Information    Shivaji, Melida Quitter, MD. Schedule an appointment as soon as possible for a visit.   Specialty: Obstetrics and Gynecology Why: 6 weeks postpartum ( check on appt for 8/16?) Contact information: [address] [place] [id number] [phone]                   05/01/2020 Isaiah Serge, DO

## 2020-05-01 NOTE — Lactation Note (Signed)
This note was copied from a baby's chart. Lactation Consultation Note  Patient Name: Allison Hamilton [id number] Date: 05/01/2020 Reason for consult: Follow-up assessment  Infant is 34 hours old 39 weeks. Mom is both breastfeeding and bottle feeding.  Mom last seen by LC at 5 am this morning education given on I/O's, positioning, and feeding cues.   LC went in the room to provide education on engorgement signs, prevention and treatment.  Mom to follow up with Pediatrician upon discharge. Mom given brochure by previous LC on lactation outpatient services and education classes.              Interventions    Lactation Tools Discussed/Used     Consult Status Consult Status: Complete Date: 05/01/20    Shuayb Schepers  Nicholson-Springer 05/01/2020, 10:39 AM

## 2020-05-01 NOTE — Lactation Note (Signed)
This note was copied from a baby's chart. Lactation Consultation Note Baby 64 hrs old. Mom called for latch assistance. Baby suckling on pacifier when LC entered room. Recommended no pacifiers for at least 2 weeks w/explaination.  Mom has compressible short shaft nipples. Baby able to latch well. Occasional swallows heard. Baby has stuffy nose. Mom stated baby pulls off frequently or goes to sleep during feeding. Mom stated baby didn't feed well like that. Made suggestion to make sure baby's nose isn't buried in breast and baby can breathe during feeding. Encouraged not to pull back on breast but push down and towards breast if looking at latch.  Newborn feeding habits, STS, I&O, breast massage, positioning, support, supply and demand discussed.  Praised mom for doing a good job. Mom denies painful latching. Lactation resources and OP services mentioned. Lactation brochure given. Encouraged to call for assistance or questions.   Patient Name: Allison Hamilton [id number] Date: 05/01/2020 Reason for consult: Initial assessment;Term   Maternal Data Has patient been taught Hand Expression?: Yes Does the patient have breastfeeding experience prior to this delivery?: Yes  Feeding Feeding Type: Breast Fed  LATCH Score Latch: Grasps breast easily, tongue down, lips flanged, rhythmical sucking.  Audible Swallowing: A few with stimulation  Type of Nipple: Everted at rest and after stimulation  Comfort (Breast/Nipple): Soft / non-tender  Hold (Positioning): Assistance needed to correctly position infant at breast and maintain latch.  LATCH Score: 8  Interventions Interventions: Breast feeding basics reviewed;Breast compression;Assisted with latch;Adjust position;Skin to skin;Support pillows;Breast massage;Position options;Hand express  Lactation Tools Discussed/Used WIC Program: Yes   Consult Status Consult Status: Follow-up Date: 05/02/20 Follow-up type:  In-patient    Charyl Dancer 05/01/2020, 5:11 AM

## 2020-05-01 NOTE — Progress Notes (Signed)
Dr. Mindi Slicker aware that Ibuprofen 600mg  needs to be sent to updated Rx ([facility] on [address]).  Will be sent from office and not thru epic. Will notify pt.

## 2021-04-21 ENCOUNTER — Emergency Department (HOSPITAL_COMMUNITY)
Admission: EM | Admit: 2021-04-21 | Discharge: 2021-04-21 | Disposition: A | Discharge status: Home / Self Care | Payer: Medicaid Other | Attending: Emergency Medicine | Admitting: Emergency Medicine

## 2021-04-21 ENCOUNTER — Other Ambulatory Visit: Payer: Self-pay

## 2021-04-21 DIAGNOSIS — J069 Acute upper respiratory infection, unspecified: Secondary | ICD-10-CM

## 2021-04-21 DIAGNOSIS — Z20822 Contact with and (suspected) exposure to covid-19: Secondary | ICD-10-CM | POA: Insufficient documentation

## 2021-04-21 DIAGNOSIS — F32A Depression, unspecified: Secondary | ICD-10-CM

## 2021-04-21 DIAGNOSIS — F419 Anxiety disorder, unspecified: Secondary | ICD-10-CM | POA: Diagnosis not present

## 2021-04-21 DIAGNOSIS — Z79899 Other long term (current) drug therapy: Secondary | ICD-10-CM | POA: Diagnosis not present

## 2021-04-21 DIAGNOSIS — N9489 Other specified conditions associated with female genital organs and menstrual cycle: Secondary | ICD-10-CM | POA: Insufficient documentation

## 2021-04-21 DIAGNOSIS — Z87891 Personal history of nicotine dependence: Secondary | ICD-10-CM | POA: Diagnosis not present

## 2021-04-21 DIAGNOSIS — Y900 Blood alcohol level of less than 20 mg/100 ml: Secondary | ICD-10-CM | POA: Insufficient documentation

## 2021-04-21 DIAGNOSIS — R0981 Nasal congestion: Secondary | ICD-10-CM | POA: Insufficient documentation

## 2021-04-21 DIAGNOSIS — N390 Urinary tract infection, site not specified: Secondary | ICD-10-CM | POA: Insufficient documentation

## 2021-04-21 LAB — COMPREHENSIVE METABOLIC PANEL
ALT: 13 U/L (ref 0–44)
AST: 18 U/L (ref 15–41)
Albumin: 3.8 g/dL (ref 3.5–5.0)
Alkaline Phosphatase: 94 U/L (ref 38–126)
Anion gap: 10 (ref 5–15)
BUN: 8 mg/dL (ref 6–20)
CO2: 22 mmol/L (ref 22–32)
Calcium: 9 mg/dL (ref 8.9–10.3)
Chloride: 104 mmol/L (ref 98–111)
Creatinine, Ser: 0.55 mg/dL (ref 0.44–1.00)
GFR, Estimated: >60 mL/min (ref >60)
Glucose, Bld: 111 mg/dL — ABNORMAL HIGH (ref 70–99)
Potassium: 3.6 mmol/L (ref 3.5–5.1)
Sodium: 136 mmol/L (ref 135–145)
Total Bilirubin: 0.3 mg/dL (ref 0.3–1.2)
Total Protein: 7.7 g/dL (ref 6.5–8.1)

## 2021-04-21 LAB — RAPID URINE DRUG SCREEN, HOSP PERFORMED
Amphetamines: NOT DETECTED
Barbiturates: NOT DETECTED
Benzodiazepines: NOT DETECTED
Cocaine: NOT DETECTED
Opiates: NOT DETECTED
Tetrahydrocannabinol: POSITIVE — AB

## 2021-04-21 LAB — CBC WITH DIFFERENTIAL/PLATELET
Abs Immature Granulocytes: 0.02 10*3/uL (ref 0.00–0.07)
Basophils Absolute: 0 10*3/uL (ref 0.0–0.1)
Basophils Relative: 0 %
Eosinophils Absolute: 0 10*3/uL (ref 0.0–0.5)
Eosinophils Relative: 0 %
HCT: 38.2 % (ref 36.0–46.0)
Hemoglobin: 12.1 g/dL (ref 12.0–15.0)
Immature Granulocytes: 0 %
Lymphocytes Relative: 18 %
Lymphs Abs: 1.4 10*3/uL (ref 0.7–4.0)
MCH: 26.4 pg (ref 26.0–34.0)
MCHC: 31.7 g/dL (ref 30.0–36.0)
MCV: 83.2 fL (ref 80.0–100.0)
Monocytes Absolute: 0.5 10*3/uL (ref 0.1–1.0)
Monocytes Relative: 6 %
Neutro Abs: 5.4 10*3/uL (ref 1.7–7.7)
Neutrophils Relative %: 76 %
Platelets: 263 10*3/uL (ref 150–400)
RBC: 4.59 MIL/uL (ref 3.87–5.11)
RDW: 13.6 % (ref 11.5–15.5)
WBC: 7.3 10*3/uL (ref 4.0–10.5)
nRBC: 0 % (ref 0.0–0.2)

## 2021-04-21 LAB — ACETAMINOPHEN LEVEL: Acetaminophen (Tylenol), Serum: <10 ug/mL — ABNORMAL LOW (ref 10–30)

## 2021-04-21 LAB — ETHANOL: Alcohol, Ethyl (B): <10 mg/dL (ref <10)

## 2021-04-21 LAB — SALICYLATE LEVEL: Salicylate Lvl: <7 mg/dL — ABNORMAL LOW (ref 7.0–30.0)

## 2021-04-21 LAB — SARS CORONAVIRUS 2 (TAT 6-24 HRS): SARS Coronavirus 2: NEGATIVE

## 2021-04-21 LAB — I-STAT BETA HCG BLOOD, ED (MC, WL, AP ONLY): I-stat hCG, quantitative: <5 m[IU]/mL (ref <5)

## 2021-04-21 MED ORDER — IBUPROFEN 400 MG PO TABS
600.0000 mg | ORAL_TABLET | Freq: Once | ORAL | Status: AC
  Administered 2021-04-21: 600 mg via ORAL
  Filled 2021-04-21: qty 1

## 2021-04-21 NOTE — BH Assessment (Signed)
There is no provider and no nurses attached to pt at this time, so TTS is unable to request pt be moved into a room with the Tele-Assessment machine. TTS to attempt at a later time. 

## 2021-04-21 NOTE — ED Triage Notes (Signed)
Pt presents to ED voluntary. She states she has increasing thoughts of depression. Concerned for impact to her children. Has several stressors, recently lost her house. Denies SI/HI. Denies ETOH/Drugs. Endorses mariguana occasionally.

## 2021-04-21 NOTE — ED Provider Notes (Addendum)
[facility] EMERGENCY DEPARTMENT Provider Note   CSN: [id number] Arrival date & time: 04/21/21  0048     History Chief Complaint  Patient presents with   Depression    Allison Hamilton is a 25 y.o. female who presents to the emergency department with a chief complaint of depression and anxiety.  The patient reports that she has been more depressed and anxious for the last month.  She denies that her symptoms are worse today. Recent stressors include losing her home.  She has 2 children -ages 1 and 3.  She still feels as though she is able to care for her children.  They are currently living with her mother, which she reports is a safe place to stay.  However, she does report that it is more stressful having more individuals in the home.  The patient states that she is familiar with [facility], but has not followed up regarding her symptoms.  She believes that she may need to be on the medication to help manage her symptoms.  She denies SI, HI, or auditory visual hallucinations.  Denies previous suicide attempts.  She states that she has still been able to go to her job, but is requesting a work note for tomorrow.  She states that she would like some help with her symptoms, but does not feel as if she needs inpatient treatments.  She also endorses cough and cold symptoms over the last few days.  She is requesting medication for headache.  She smokes marijuana.  Reports social alcohol use.  Denies nicotine use.  No other illicit or recreational substance use.  The history is provided by the patient and medical records. No language interpreter was used.      Past Medical History:  Diagnosis Date   Headache     Patient Active Problem List   Diagnosis Date Noted   [redacted] weeks gestation of pregnancy 04/29/2020   Normal labor 03/16/2018   SVD (spontaneous vaginal delivery) 03/15/2018   Post term pregnancy 03/14/2018   H/O poor fetal growth    Encounter for other antenatal  screening follow-up    Family history of congenital anomalies    Maternal care for other known or suspected poor fetal growth, third trimester, not applicable or unspecified    Blunt abdominal trauma 12/06/2017   Vaginal yeast infection 09/20/2017   Vitamin D deficiency 08/23/2017   Maternal varicella, non-immune 08/23/2017   Supervision of other normal pregnancy, antepartum 08/21/2017   Family history of spina bifida 08/21/2017   History of twin pregnancy in prior pregnancy 08/21/2017   Major depressive disorder, recurrent episode, moderate (HCC) 03/27/2017    Past Surgical History:  Procedure Laterality Date   NO PAST SURGERIES       OB History     Gravida  3   Para  2   Term  2   Preterm      AB  1   Living  2      SAB  1   IAB  0   Ectopic      Multiple  0   Live Births  2           Family History  Problem Relation Age of Onset   Spina bifida Sister     Social History   Tobacco Use   Smoking status: Former    Types: Cigarettes    Quit date: 07/2017    Years since quitting: 3.7   Smokeless tobacco: Never  Vaping Use   Vaping Use: Never used  Substance Use Topics   Alcohol use: No   Drug use: No    Home Medications Prior to Admission medications   Medication Sig Start Date End Date Taking? Authorizing Provider  ibuprofen (ADVIL) 600 MG tablet Take 1 tablet (600 mg total) by mouth every 6 (six) hours as needed for moderate pain or cramping. 05/01/20   Edwinna Areola, DO    Allergies    Patient has no known allergies.  Review of Systems   Review of Systems  Constitutional:  Negative for activity change, chills, diaphoresis and fever.  HENT:  Positive for congestion. Negative for ear discharge, ear pain, hearing loss, mouth sores, nosebleeds, rhinorrhea, sinus pressure, sneezing and sore throat.   Respiratory:  Positive for cough. Negative for shortness of breath.   Cardiovascular:  Negative for chest pain.  Gastrointestinal:   Negative for abdominal pain, constipation, diarrhea, nausea and vomiting.  Genitourinary:  Negative for dysuria, frequency and urgency.  Musculoskeletal:  Negative for back pain, myalgias and neck pain.  Skin:  Negative for rash.  Allergic/Immunologic: Negative for immunocompromised state.  Neurological:  Positive for headaches. Negative for dizziness, seizures, syncope, weakness and numbness.  Psychiatric/Behavioral:  Negative for confusion.    Physical Exam Updated Vital Signs BP 122/64 (BP Location: Right Arm)   Pulse 63   Temp 98.6 F (37 C) (Oral)   Resp 18   SpO2 100%   Physical Exam Vitals and nursing note reviewed.  Constitutional:      General: She is not in acute distress.    Appearance: She is not ill-appearing, toxic-appearing or diaphoretic.  HENT:     Head: Normocephalic.     Nose: Congestion present.  Eyes:     Conjunctiva/sclera: Conjunctivae normal.  Cardiovascular:     Rate and Rhythm: Normal rate and regular rhythm.     Heart sounds: No murmur heard.   No friction rub. No gallop.  Pulmonary:     Effort: Pulmonary effort is normal. No respiratory distress.     Breath sounds: No stridor. No wheezing, rhonchi or rales.     Comments: Lungs are clear to auscultation bilaterally. Chest:     Chest wall: No tenderness.  Abdominal:     General: There is no distension.     Palpations: Abdomen is soft. There is no mass.     Tenderness: There is no abdominal tenderness. There is no right CVA tenderness, left CVA tenderness, guarding or rebound.     Hernia: No hernia is present.  Musculoskeletal:     Cervical back: Neck supple.     Right lower leg: No edema.     Left lower leg: No edema.  Skin:    General: Skin is warm.     Coloration: Skin is not pale.     Findings: No rash.  Neurological:     General: No focal deficit present.     Mental Status: She is alert.  Psychiatric:        Attention and Perception: She does not perceive auditory or visual  hallucinations.        Mood and Affect: Affect is flat.        Speech: Speech normal.        Behavior: Behavior normal.        Thought Content: Thought content does not include homicidal or suicidal ideation. Thought content does not include homicidal or suicidal plan.    ED Results / Procedures /  Treatments   Labs (all labs ordered are listed, but only abnormal results are displayed) Labs Reviewed  COMPREHENSIVE METABOLIC PANEL - Abnormal; Notable for the following components:      Result Value   Glucose, Bld 111 (*)    All other components within normal limits  RAPID URINE DRUG SCREEN, HOSP PERFORMED - Abnormal; Notable for the following components:   Tetrahydrocannabinol POSITIVE (*)    All other components within normal limits  SALICYLATE LEVEL - Abnormal; Notable for the following components:   Salicylate Lvl <7.0 (*)    All other components within normal limits  ACETAMINOPHEN LEVEL - Abnormal; Notable for the following components:   Acetaminophen (Tylenol), Serum <10 (*)    All other components within normal limits  SARS CORONAVIRUS 2 (TAT 6-24 HRS)  ETHANOL  CBC WITH DIFFERENTIAL/PLATELET  I-STAT BETA HCG BLOOD, ED ([facility], [facility], [facility] ONLY)    EKG None  Radiology No results found.  Procedures Procedures   Medications Ordered in ED Medications  ibuprofen (ADVIL) tablet 600 mg (600 mg Oral Given 04/21/21 0459)    ED Course  I have reviewed the triage vital signs and the nursing notes.  Pertinent labs & imaging results that were available during my care of the patient were reviewed by me and considered in my medical decision making (see chart for details).    MDM Rules/Calculators/A&P                           25 year old female with no chronic medical conditions presenting with worsening depression and anxiety over the last month.  Recent stressors include leaving her home, but she and her 2 children have now moved in with family.  She denies SI, HI, or auditory and  visual hallucinations.  She also adds that she has been having URI symptoms over the last few days.  Vital signs are stable.  Physical exam is reassuring.  Lungs are clear to auscultation bilaterally.  Labs and imaging of been reviewed and independently interpreted by me.  No metabolic derangements.  CBC is unremarkable.  UDS positive for THC.  Ethanol, salicylate, and Tylenol levels are not elevated.  She is medically cleared at this time.  Since she has been endorsing URI symptoms for the last few days, will add on COVID-19 test.  Doubt mania, psychosis, ACS, PE, aortic dissection.  In regards to the patient's worsening depression anxiety, she adamantly denies SI, HI, or auditory visual hallucinations.  She currently has a safe place to live with her 2 children.  She is supported at bedside by her significant other.  At this time, she does not meet inpatient criteria.  I discussed the possibility of inpatient behavioral health treatment with the patient and her significant other at bedside.  The patient was adamant that she did not feel that she would benefit or needed inpatient treatment at this time.  Reports that she was mostly looking for resources to help her manage her symptoms.   We will provide the patient community resources for anxiety and depression.  She is agreeable with this plan and all questions have been answered.  She was given strict return precautions to the emergency department for new or worsening symptoms.  Safer discharge to home with outpatient follow-up as discussed.  Final Clinical Impression(s) / ED Diagnoses Final diagnoses:  Depression, unspecified depression type  Upper respiratory tract infection, unspecified type    Rx / DC Orders ED Discharge Orders  None        Chonita Gadea A, PA-C 04/21/21 0700    Frederik PearMcDonald, Lucindy Borel A, PA-C 04/21/21 0701    Dione BoozeGlick, David, MD 04/21/21 [phone]

## 2021-04-21 NOTE — ED Provider Notes (Signed)
Emergency Medicine Provider Triage Evaluation Note  Allison Hamilton , a 25 y.o. female  was evaluated in triage.  Pt complains of depression.  Pt reports she feels like she is "losing it."  Pt has 2 children - age 62 & 3. Pt reports she is emotional and doesn't want to be bothered.  Pt reports this started approx 1 month ago after losing her house.  Denies SI, HI and AVH.  Pt reports anxiety.  No treatments PTA. Pt reports previous mental health admissions for depression.  Years since the last admission.  Pt reports social alcohol usage, smokes marijuana, denies nicotine usage.  Review of Systems  Positive: Depression, anxiety Negative: SI/HI, hallucinations  Physical Exam  BP 134/84 (BP Location: Right Arm)   Pulse (!) 107   Temp 98.6 F (37 C) (Oral)   Resp 20   SpO2 98%  Gen:   Awake, no distress   Resp:  Normal effort  MSK:   Moves extremities without difficulty  Other:  withdrawn  Medical Decision Making  Medically screening exam initiated at 1:13 AM.  Appropriate orders placed.  Allison Hamilton was informed that the remainder of the evaluation will be completed by another provider, this initial triage assessment does not replace that evaluation, and the importance of remaining in the ED until their evaluation is complete.   Depression.   Allison Hamilton, Allison Hamilton 04/21/21 0116    Dione Booze, MD 04/21/21 [phone]

## 2021-04-21 NOTE — Discharge Instructions (Addendum)
Thank you for allowing me to care for you today in the Emergency Department.   You can follow-up with [facility] for depression and anxiety.  You have a pending COVID-19 test.  You can use the MyChart app to review the results of your COVID-19 test in the next 24 hours.  If it is positive, please follow CDC guidelines, which recommends quarantining at home for 5 days after your symptoms began.  You can take Motrin and Tylenol for headache and body aches.  Follow-up with your primary care provider if your COVID-19 test is negative and your symptoms persist.  Return to the emergency department if you have thoughts of wanting to harm yourself or others, or other new, concerning symptoms.

## 2021-08-02 ENCOUNTER — Encounter: Payer: Self-pay | Admitting: *Deleted

## 2021-09-05 ENCOUNTER — Encounter (HOSPITAL_COMMUNITY): Payer: Self-pay | Admitting: Emergency Medicine

## 2021-09-05 ENCOUNTER — Other Ambulatory Visit: Payer: Self-pay

## 2021-09-05 ENCOUNTER — Emergency Department (HOSPITAL_COMMUNITY)
Admission: EM | Admit: 2021-09-05 | Discharge: 2021-09-05 | Disposition: A | Discharge status: Home / Self Care | Payer: BC Managed Care – PPO | Attending: Emergency Medicine | Admitting: Emergency Medicine

## 2021-09-05 ENCOUNTER — Emergency Department (HOSPITAL_COMMUNITY): Payer: BC Managed Care – PPO

## 2021-09-05 DIAGNOSIS — F329 Major depressive disorder, single episode, unspecified: Secondary | ICD-10-CM | POA: Insufficient documentation

## 2021-09-05 DIAGNOSIS — Z87891 Personal history of nicotine dependence: Secondary | ICD-10-CM | POA: Diagnosis not present

## 2021-09-05 DIAGNOSIS — N9489 Other specified conditions associated with female genital organs and menstrual cycle: Secondary | ICD-10-CM | POA: Insufficient documentation

## 2021-09-05 DIAGNOSIS — F32A Depression, unspecified: Secondary | ICD-10-CM

## 2021-09-05 DIAGNOSIS — R0789 Other chest pain: Secondary | ICD-10-CM | POA: Insufficient documentation

## 2021-09-05 HISTORY — DX: Anxiety disorder, unspecified: F41.9

## 2021-09-05 LAB — BASIC METABOLIC PANEL WITH GFR
Anion gap: 8 (ref 5–15)
BUN: 8 mg/dL (ref 6–20)
CO2: 25 mmol/L (ref 22–32)
Calcium: 8.9 mg/dL (ref 8.9–10.3)
Chloride: 104 mmol/L (ref 98–111)
Creatinine, Ser: 0.64 mg/dL (ref 0.44–1.00)
GFR, Estimated: >60 mL/min
Glucose, Bld: 109 mg/dL — ABNORMAL HIGH (ref 70–99)
Potassium: 3.4 mmol/L — ABNORMAL LOW (ref 3.5–5.1)
Sodium: 137 mmol/L (ref 135–145)

## 2021-09-05 LAB — I-STAT BETA HCG BLOOD, ED (MC, WL, AP ONLY): I-stat hCG, quantitative: <5 m[IU]/mL

## 2021-09-05 LAB — CBC
HCT: 40.4 % (ref 36.0–46.0)
Hemoglobin: 12.8 g/dL (ref 12.0–15.0)
MCH: 26.8 pg (ref 26.0–34.0)
MCHC: 31.7 g/dL (ref 30.0–36.0)
MCV: 84.7 fL (ref 80.0–100.0)
Platelets: 207 10*3/uL (ref 150–400)
RBC: 4.77 MIL/uL (ref 3.87–5.11)
RDW: 14.1 % (ref 11.5–15.5)
WBC: 4.9 10*3/uL (ref 4.0–10.5)
nRBC: 0 % (ref 0.0–0.2)

## 2021-09-05 LAB — TROPONIN I (HIGH SENSITIVITY): Troponin I (High Sensitivity): 3 ng/L (ref <18)

## 2021-09-05 MED ORDER — IBUPROFEN 800 MG PO TABS
800.0000 mg | ORAL_TABLET | Freq: Once | ORAL | Status: AC
  Administered 2021-09-05: 10:00:00 800 mg via ORAL
  Filled 2021-09-05: qty 1

## 2021-09-05 MED ORDER — OXYCODONE-ACETAMINOPHEN 5-325 MG PO TABS
2.0000 | ORAL_TABLET | Freq: Once | ORAL | Status: AC
  Administered 2021-09-05: 10:00:00 2 via ORAL
  Filled 2021-09-05: qty 2

## 2021-09-05 NOTE — ED Triage Notes (Signed)
Pt reports intermittent chest pain with SOB and nausea x 2-3 weeks.  Also reports anxiety.

## 2021-09-05 NOTE — Discharge Instructions (Addendum)
Please follow up with behavioral health. Return if you are having new or worsening symptoms

## 2021-09-05 NOTE — ED Provider Notes (Signed)
[facility] EMERGENCY DEPARTMENT Provider Note   CSN: [id number] Arrival date & time: 09/05/21  0740     History Chief Complaint  Patient presents with   Chest Pain    Allison Hamilton is a 25 y.o. female.  HPI 25 year old female previously healthy presents today complaining of chest pain.  It is sharp and substernal with some radiation to the right side.  Pain has been present over the past 2 weeks it comes and goes.  She is unable to tell me anything that seems to make it specifically worse except some movement of her right shoulder.  She has some dyspnea with deep breaths.  She has no history of DVT or PE.  She states her last menstrual period was approximately a month ago.  States her periods are irregular.  She has been pregnant 3 times and has had 2 children 1 miscarriage.  She is not currently using any birth control although she denies actively trying to become pregnant.  She smokes marijuana but does not smoke cigarettes.  She has occasional alcohol use denies any other illicit drug use.  She has not had similar symptoms in the past.  She has not had fever or chills.     Past Medical History:  Diagnosis Date   Anxiety    Headache     Patient Active Problem List   Diagnosis Date Noted   [redacted] weeks gestation of pregnancy 04/29/2020   Normal labor 03/16/2018   SVD (spontaneous vaginal delivery) 03/15/2018   Post term pregnancy 03/14/2018   H/O poor fetal growth    Encounter for other antenatal screening follow-up    Family history of congenital anomalies    Maternal care for other known or suspected poor fetal growth, third trimester, not applicable or unspecified    Blunt abdominal trauma 12/06/2017   Vaginal yeast infection 09/20/2017   Vitamin D deficiency 08/23/2017   Maternal varicella, non-immune 08/23/2017   Supervision of other normal pregnancy, antepartum 08/21/2017   Family history of spina bifida 08/21/2017   History of twin pregnancy in  prior pregnancy 08/21/2017   Major depressive disorder, recurrent episode, moderate (HCC) 03/27/2017    Past Surgical History:  Procedure Laterality Date   NO PAST SURGERIES       OB History     Gravida  3   Para  2   Term  2   Preterm      AB  1   Living  2      SAB  1   IAB  0   Ectopic      Multiple  0   Live Births  2           Family History  Problem Relation Age of Onset   Spina bifida Sister     Social History   Tobacco Use   Smoking status: Former    Types: Cigarettes    Quit date: 07/2017    Years since quitting: 4.1   Smokeless tobacco: Never  Vaping Use   Vaping Use: Never used  Substance Use Topics   Alcohol use: No   Drug use: No    Home Medications Prior to Admission medications   Medication Sig Start Date End Date Taking? Authorizing Provider  ibuprofen (ADVIL) 600 MG tablet Take 1 tablet (600 mg total) by mouth every 6 (six) hours as needed for moderate pain or cramping. 05/01/20   Edwinna Areola, DO    Allergies  Patient has no known allergies.  Review of Systems   Review of Systems  All other systems reviewed and are negative.  Physical Exam Updated Vital Signs BP 140/85    Pulse 85    Temp 98.6 F (37 C) (Oral)    Resp 18    LMP 08/07/2021    SpO2 100%   Physical Exam Vitals and nursing note reviewed.  Constitutional:      General: She is not in acute distress.    Appearance: She is well-developed.  HENT:     Head: Normocephalic and atraumatic.     Right Ear: External ear normal.     Left Ear: External ear normal.     Nose: Nose normal.  Eyes:     Conjunctiva/sclera: Conjunctivae normal.     Pupils: Pupils are equal, round, and reactive to light.  Cardiovascular:     Heart sounds: Normal heart sounds.  Pulmonary:     Effort: Pulmonary effort is normal.     Breath sounds: Normal breath sounds.  Chest:     Chest wall: No mass.  Abdominal:     General: Bowel sounds are normal.     Palpations:  Abdomen is soft.  Musculoskeletal:        General: Normal range of motion.     Cervical back: Normal range of motion and neck supple.  Skin:    General: Skin is warm and dry.  Neurological:     Mental Status: She is alert and oriented to person, place, and time.     Motor: No abnormal muscle tone.     Coordination: Coordination normal.  Psychiatric:        Behavior: Behavior normal.        Thought Content: Thought content normal.    ED Results / Procedures / Treatments   Labs (all labs ordered are listed, but only abnormal results are displayed) Labs Reviewed  BASIC METABOLIC PANEL - Abnormal; Notable for the following components:      Result Value   Potassium 3.4 (*)    Glucose, Bld 109 (*)    All other components within normal limits  RESP PANEL BY RT-PCR (FLU A&B, COVID) ARPGX2  CBC  D-DIMER, QUANTITATIVE (NOT AT [facility])  I-STAT BETA HCG BLOOD, ED ([facility], [facility], [facility] ONLY)  TROPONIN I (HIGH SENSITIVITY)  TROPONIN I (HIGH SENSITIVITY)    EKG EKG Interpretation  Date/Time:  Sunday September 05 2021 07:54:23 EST Ventricular Rate:  79 PR Interval:  144 QRS Duration: 72 QT Interval:  344 QTC Calculation: 394 R Axis:   73 Text Interpretation: Normal sinus rhythm with sinus arrhythmia Normal ECG Confirmed by Margarita Grizzle [phone]) on 09/05/2021 8:38:12 AM  Radiology DG Chest 2 View  Result Date: 09/05/2021 CLINICAL DATA:  Intermittent chest pain and nausea for the past 2 weeks. EXAM: CHEST - 2 VIEW COMPARISON:  09/29/2016; 01/21/2016 FINDINGS: Grossly unchanged cardiac silhouette and mediastinal contours. No focal parenchymal opacities. No pleural effusion or pneumothorax. No evidence of edema. No acute osseous abnormalities. IMPRESSION: No acute cardiopulmonary disease. Electronically Signed   By: Simonne Come M.D.   On: 09/05/2021 08:50    Procedures Procedures   Medications Ordered in ED Medications  oxyCODONE-acetaminophen (PERCOCET/ROXICET) 5-325 MG per tablet 2 tablet (2  tablets Oral Given 09/05/21 0949)  ibuprofen (ADVIL) tablet 800 mg (800 mg Oral Given 09/05/21 0949)    ED Course  I have reviewed the triage vital signs and the nursing notes.  Pertinent labs & imaging results  that were available during my care of the patient were reviewed by me and considered in my medical decision making (see chart for details).  Clinical Course as of 09/05/21 1022  Sun Sep 05, 2021  0902 Chest x-Abigail Teall PA and lateral reviewed without any evidence of acute abnormality Radiologist interpretation concurs [DR]  0930 bmet reviewed with mild hypokalemia CBC reviewed and normal Troponin is normal Patient is not pregnant Awaiting D-dimer and flu panel. [DR]  1001 RN reported that patient voiced some depression. I discussed with patient.  She voices some sadness and depression symptoms.  She is not suicidal or homicidal and has had no plans or attempts in the past.  She reports that she has had some ongoing depression but has never been treated before.  She reports that she has some difficulty sleeping and gets some poor appetite.  She has had a lot of stressors going on but does not name anything specific.  I discussed giving her follow-up and referrals and she voices appreciation of this plan. [DR]    Clinical Course User Index [DR] Margarita Grizzle, MD   MDM Rules/Calculators/A&P                         Patient seen and evaluated for chest pain.  Differential diagnosis of serious/life threatening causes of chest pain includes ACS, other diseases of the heart such as myocarditis or pericarditis, lung etiologies such as infection or pneumothorax, diseases of the great vessels such as aortic dissection or AAA, pulmonary embolism, or GI sources such as cholecystitis or other upper abdominal causes. Doubt ACS- heart score documented, EKG reviewed, Given the timing of pain to ER presentation, single troponin* negative and doubt NSTEMI troponin and repeat troponin obtained and WNL Doubt  myocarditis/pericarditis/tamponade based on history, review of ekg and labs Doubt aortic dissection based on history and review of imaging Doubt intrinsic lung causes such as pneumonia or pneumothorax, based on history, physical exam, and studies obtained. Doubt PE based on history, physical exam, and PERC Doubt acute GI etiology requiring intervention based on history, physical exam and labs. Patient appears stable for discharge. Return precautions and need for follow up discussed and patient voices understanding    Final Clinical Impression(s) / ED Diagnoses Final diagnoses:  Chest wall pain  Depression, unspecified depression type    Rx / DC Orders ED Discharge Orders     None        Margarita Grizzle, MD 09/05/21 1022

## 2021-09-05 NOTE — ED Provider Notes (Signed)
Emergency Medicine Provider Triage Evaluation Note  Allison Hamilton , a 25 y.o. female  was evaluated in triage.  Pt complains of intermittent sharp chest pain for 2 weeks.  Review of Systems  Positive: Dyspnea mild with some deep breaths, Negative: Nasal congestion, ho dvt, fever, chills  Physical Exam  BP 117/71 (BP Location: Right Arm)    Pulse 82    Temp 98.6 F (37 C) (Oral)    Resp 16    LMP 08/07/2021    SpO2 100%  Gen:   Awake, no distress   Resp:  Normal effort lungs cta MSK:   Moves extremities without difficulty  Other:  Mild nasal d/c clear  Medical Decision Making  Medically screening exam initiated at 8:07 AM.  Appropriate orders placed.  Allison Hamilton was informed that the remainder of the evaluation will be completed by another provider, this initial triage assessment does not replace that evaluation, and the importance of remaining in the ED until their evaluation is complete.  Plan labs, ekg, cxr, preg   Margarita Grizzle, MD 09/05/21 [phone]

## 2022-09-28 ENCOUNTER — Ambulatory Visit
Admission: EM | Admit: 2022-09-28 | Discharge: 2022-09-28 | Disposition: A | Discharge status: Home / Self Care | Payer: Medicaid Other | Attending: Physician Assistant | Admitting: Physician Assistant

## 2022-09-28 DIAGNOSIS — H01004 Unspecified blepharitis left upper eyelid: Secondary | ICD-10-CM | POA: Diagnosis not present

## 2022-09-28 MED ORDER — METHYLPREDNISOLONE SODIUM SUCC 125 MG IJ SOLR
80.0000 mg | Freq: Once | INTRAMUSCULAR | Status: AC
  Administered 2022-09-28: 80 mg via INTRAMUSCULAR

## 2022-09-28 NOTE — ED Triage Notes (Signed)
Pt presents with significant left eye swelling from unknown source since yesterday.

## 2022-09-28 NOTE — ED Provider Notes (Signed)
[facility]    CSN: [id number] Arrival date & time: 09/28/22  1316      History   Chief Complaint Chief Complaint  Patient presents with   Facial Swelling    HPI Allison Hamilton is a 27 y.o. female.   Patient here today for evaluation of left eye swelling that started yesterday.  She reports that she has tried taking Benadryl without significant improvement.  She denies any vision changes.  She has not had any significant pain.  She does note some itching.  She denies using any new products on her eyes or face however does report that she has had false lashes for 3 weeks.  She is not sure if this is related but has not had reaction to her other eye.  She denies any shortness of breath or trouble swallowing.  The history is provided by the patient.    Past Medical History:  Diagnosis Date   Anxiety    Headache     Patient Active Problem List   Diagnosis Date Noted   [redacted] weeks gestation of pregnancy 04/29/2020   Normal labor 03/16/2018   SVD (spontaneous vaginal delivery) 03/15/2018   Post term pregnancy 03/14/2018   H/O poor fetal growth    Encounter for other antenatal screening follow-up    Family history of congenital anomalies    Maternal care for other known or suspected poor fetal growth, third trimester, not applicable or unspecified    Blunt abdominal trauma 12/06/2017   Vaginal yeast infection 09/20/2017   Vitamin D deficiency 08/23/2017   Maternal varicella, non-immune 08/23/2017   Supervision of other normal pregnancy, antepartum 08/21/2017   Family history of spina bifida 08/21/2017   History of twin pregnancy in prior pregnancy 08/21/2017   Major depressive disorder, recurrent episode, moderate ([place]) 03/27/2017    Past Surgical History:  Procedure Laterality Date   NO PAST SURGERIES      OB History     Gravida  3   Para  2   Term  2   Preterm      AB  1   Living  2      SAB  1   IAB  0   Ectopic      Multiple  0    Live Births  2            Home Medications    Prior to Admission medications   Medication Sig Start Date End Date Taking? Authorizing Provider  ibuprofen (ADVIL) 600 MG tablet Take 1 tablet (600 mg total) by mouth every 6 (six) hours as needed for moderate pain or cramping. 05/01/20   Sherlyn Hay, DO    Family History Family History  Problem Relation Age of Onset   Spina bifida Sister     Social History Social History   Tobacco Use   Smoking status: Former    Types: Cigarettes    Quit date: 07/2017    Years since quitting: 5.1   Smokeless tobacco: Never  Vaping Use   Vaping Use: Never used  Substance Use Topics   Alcohol use: No   Drug use: No     Allergies   Patient has no known allergies.   Review of Systems Review of Systems  Constitutional:  Negative for chills and fever.  HENT:  Positive for facial swelling. Negative for trouble swallowing.   Eyes:  Negative for pain, discharge, redness and visual disturbance.  Respiratory:  Negative for shortness of breath.  Gastrointestinal:  Negative for nausea and vomiting.     Physical Exam Triage Vital Signs ED Triage Vitals  Enc Vitals Group     BP 09/28/22 1530 135/84     Pulse Rate 09/28/22 1530 71     Resp 09/28/22 1530 18     Temp 09/28/22 1530 98 F (36.7 C)     Temp Source 09/28/22 1530 Oral     SpO2 09/28/22 1530 97 %     Weight --      Height --      Head Circumference --      Peak Flow --      Pain Score 09/28/22 1529 1     Pain Loc --      Pain Edu? --      Excl. in [place]? --    No data found.  Updated Vital Signs BP 135/84 (BP Location: Right Arm)   Pulse 71   Temp 98 F (36.7 C) (Oral)   Resp 18   LMP 09/11/2022   SpO2 97%      Physical Exam Vitals and nursing note reviewed.  Constitutional:      General: She is not in acute distress.    Appearance: Normal appearance. She is not ill-appearing.  HENT:     Head: Normocephalic and atraumatic.  Eyes:     Extraocular  Movements: Extraocular movements intact.     Conjunctiva/sclera: Conjunctivae normal.     Pupils: Pupils are equal, round, and reactive to light.     Comments: Edema present to left upper lid diffusely with mild associated erythema  Cardiovascular:     Rate and Rhythm: Normal rate.  Pulmonary:     Effort: Pulmonary effort is normal.  Neurological:     Mental Status: She is alert.  Psychiatric:        Mood and Affect: Mood normal.        Behavior: Behavior normal.        Thought Content: Thought content normal.      UC Treatments / Results  Labs (all labs ordered are listed, but only abnormal results are displayed) Labs Reviewed - No data to display  EKG   Radiology No results found.  Procedures Procedures (including critical care time)  Medications Ordered in UC Medications  methylPREDNISolone sodium succinate (SOLU-MEDROL) 125 mg/2 mL injection 80 mg (80 mg Intramuscular Given 09/28/22 1556)    Initial Impression / Assessment and Plan / UC Course  I have reviewed the triage vital signs and the nursing notes.  Pertinent labs & imaging results that were available during my care of the patient were reviewed by me and considered in my medical decision making (see chart for details).    Steroid injection administered in office to hopefully help with swelling and recommended cold compresses at home.  Encouraged follow-up if no gradual improvement with any further concerns.  Final Clinical Impressions(s) / UC Diagnoses   Final diagnoses:  Blepharitis of left upper eyelid, unspecified type   Discharge Instructions   None    ED Prescriptions   None    PDMP not reviewed this encounter.   Francene Finders, PA-C 09/28/22 1704

## 2023-01-11 ENCOUNTER — Ambulatory Visit (HOSPITAL_COMMUNITY): Payer: Self-pay

## 2023-08-29 ENCOUNTER — Encounter (HOSPITAL_BASED_OUTPATIENT_CLINIC_OR_DEPARTMENT_OTHER): Payer: Self-pay | Admitting: Student

## 2023-08-30 ENCOUNTER — Encounter (HOSPITAL_BASED_OUTPATIENT_CLINIC_OR_DEPARTMENT_OTHER): Payer: Self-pay | Admitting: Student

## 2023-08-30 ENCOUNTER — Other Ambulatory Visit: Payer: Self-pay

## 2023-08-30 NOTE — Progress Notes (Signed)
Spoke w/ via phone for pre-op interview---Allison Hamilton needs dos---- none per anesthesia, surgeon orders pending        Hamilton results------none COVID test -----patient states asymptomatic no test needed Arrive at -------1200 on Monday, 09/04/23 NPO after MN NO Solid Food.  Clear liquids from MN until---1100 Med rec completed Medications to take morning of surgery -----none Diabetic medication -----n/a Patient instructed no nail polish to be worn day of surgery Patient instructed to bring photo id and insurance card day of surgery Patient aware to have Driver (ride ) / caregiver    for 24 hours after surgery - mom - Allison Hamilton Patient Special Instructions -----none Pre-Op special Instructions -----Requested orders from Dr. Katrinka Blazing via Epic IB on 08/30/23. Patient verbalized understanding of instructions that were given at this phone interview. Patient denies chest pain, sob, fever, cough at the interview.

## 2023-08-31 DIAGNOSIS — O021 Missed abortion: Secondary | ICD-10-CM | POA: Diagnosis present

## 2023-09-01 NOTE — H&P (Signed)
Allison Hamilton is a 27 y.o. female now [id number] presenting for surgery.   She was diagnosed with a missed abortion on 12/9 when US showed CRL 5 mm without cardiac activity. She had a prior US >11 days earlier where a CRL < 7 mm was seen without cardiac activity.  OB History     Gravida  4   Para  2   Term  2   Preterm      AB  1   Living  2      SAB  1   IAB  0   Ectopic      Multiple  0   Live Births  2          Past Medical History:  Diagnosis Date   Anxiety and depression    Headache    migraines   Miscarriage 2016   twins   Past Surgical History:  Procedure Laterality Date   NO PAST SURGERIES     Family History: family history includes Spina bifida in her sister. Social History:  reports that she quit smoking about 6 years ago. Her smoking use included cigarettes. She has never used smokeless tobacco. She reports current drug use. Drug: Marijuana. She reports that she does not drink alcohol.       Review of Systems  Constitutional:  Negative for chills and fever.  Respiratory:  Negative for shortness of breath.   Cardiovascular:  Negative for chest pain.  Genitourinary:  Negative for pelvic pain, vaginal bleeding and vaginal discharge.   History   Height 5\' 4"  (1.626 m), weight 60.8 kg, not currently breastfeeding. Exam Physical Exam Constitutional:      General: She is not in acute distress.    Appearance: Normal appearance.  HENT:     Head: Normocephalic and atraumatic.  Pulmonary:     Effort: Pulmonary effort is normal.  Musculoskeletal:        General: Normal range of motion.  Skin:    General: Skin is warm and dry.  Neurological:     Mental Status: She is alert.  Psychiatric:        Mood and Affect: Mood normal.        Behavior: Behavior normal.     Prenatal labs: ABO, Rh:  Apos  Assessment/Plan: 27 yo [id number] with 1st trimester missed abortion . She was counseled on her options and R/B of each in the office and opted for  surgical management With D&C.  - Doxycycline pre-op - To OR when ready  Allison Hamilton 09/01/2023, 2:45 PM

## 2023-09-03 NOTE — Anesthesia Preprocedure Evaluation (Signed)
Anesthesia Evaluation  Patient identified by MRN, date of birth, ID band Patient awake    Reviewed: Allergy & Precautions, NPO status , Patient's Chart, lab work & pertinent test results  History of Anesthesia Complications Negative for: history of anesthetic complications  Airway Mallampati: II  TM Distance: >3 FB Neck ROM: Full    Dental no notable dental hx.    Pulmonary former smoker   Pulmonary exam normal        Cardiovascular negative cardio ROS Normal cardiovascular exam     Neuro/Psych  Headaches  Anxiety Depression       GI/Hepatic negative GI ROS, Neg liver ROS,,,  Endo/Other  negative endocrine ROS    Renal/GU negative Renal ROS     Musculoskeletal negative musculoskeletal ROS (+)    Abdominal   Peds  Hematology negative hematology ROS (+)   Anesthesia Other Findings Day of surgery medications reviewed with patient.  Reproductive/Obstetrics Missed Ab                              Anesthesia Physical Anesthesia Plan  ASA: 2  Anesthesia Plan: General   Post-op Pain Management: Tylenol PO (pre-op)* and Toradol IV (intra-op)*   Induction: Intravenous  PONV Risk Score and Plan: 3 and Ondansetron, Dexamethasone, Treatment may vary due to age or medical condition, Midazolam and Scopolamine patch - Pre-op  Airway Management Planned: LMA  Additional Equipment: None  Intra-op Plan:   Post-operative Plan: Extubation in OR  Informed Consent: I have reviewed the patients History and Physical, chart, labs and discussed the procedure including the risks, benefits and alternatives for the proposed anesthesia with the patient or authorized representative who has indicated his/her understanding and acceptance.     Dental advisory given  Plan Discussed with: CRNA  Anesthesia Plan Comments:         Anesthesia Quick Evaluation

## 2023-09-04 ENCOUNTER — Ambulatory Visit (HOSPITAL_BASED_OUTPATIENT_CLINIC_OR_DEPARTMENT_OTHER)
Admission: RE | Admit: 2023-09-04 | Discharge: 2023-09-04 | Disposition: A | Discharge status: Home / Self Care | Payer: BC Managed Care – PPO | Attending: Student | Admitting: Student

## 2023-09-04 ENCOUNTER — Encounter (HOSPITAL_BASED_OUTPATIENT_CLINIC_OR_DEPARTMENT_OTHER): Payer: Self-pay | Admitting: Student

## 2023-09-04 ENCOUNTER — Other Ambulatory Visit: Payer: Self-pay

## 2023-09-04 ENCOUNTER — Ambulatory Visit (HOSPITAL_BASED_OUTPATIENT_CLINIC_OR_DEPARTMENT_OTHER): Payer: BC Managed Care – PPO | Admitting: Anesthesiology

## 2023-09-04 ENCOUNTER — Encounter (HOSPITAL_BASED_OUTPATIENT_CLINIC_OR_DEPARTMENT_OTHER)
Admission: RE | Disposition: A | Discharge status: Home / Self Care | Payer: Self-pay | Source: Home / Self Care | Attending: Student

## 2023-09-04 DIAGNOSIS — O021 Missed abortion: Secondary | ICD-10-CM | POA: Diagnosis present

## 2023-09-04 DIAGNOSIS — Z01818 Encounter for other preprocedural examination: Secondary | ICD-10-CM

## 2023-09-04 DIAGNOSIS — Z87891 Personal history of nicotine dependence: Secondary | ICD-10-CM | POA: Insufficient documentation

## 2023-09-04 HISTORY — DX: Depression, unspecified: F32.A

## 2023-09-04 HISTORY — PX: DILATION AND EVACUATION: SHX1459

## 2023-09-04 HISTORY — DX: Depression, unspecified: F41.9

## 2023-09-04 LAB — CBC
HCT: 37.2 % (ref 36.0–46.0)
Hemoglobin: 12.6 g/dL (ref 12.0–15.0)
MCH: 29.7 pg (ref 26.0–34.0)
MCHC: 33.9 g/dL (ref 30.0–36.0)
MCV: 87.7 fL (ref 80.0–100.0)
Platelets: 172 10*3/uL (ref 150–400)
RBC: 4.24 MIL/uL (ref 3.87–5.11)
RDW: 13.3 % (ref 11.5–15.5)
WBC: 5.1 10*3/uL (ref 4.0–10.5)
nRBC: 0 % (ref 0.0–0.2)

## 2023-09-04 LAB — TYPE AND SCREEN
ABO/RH(D): A POS
Antibody Screen: NEGATIVE

## 2023-09-04 SURGERY — DILATION AND EVACUATION, UTERUS
Anesthesia: General | Site: Vagina

## 2023-09-04 MED ORDER — ONDANSETRON HCL 4 MG/2ML IJ SOLN
INTRAMUSCULAR | Status: AC
  Filled 2023-09-04: qty 2

## 2023-09-04 MED ORDER — OXYCODONE HCL 5 MG PO TABS
ORAL_TABLET | ORAL | Status: AC
  Filled 2023-09-04: qty 1

## 2023-09-04 MED ORDER — TRANEXAMIC ACID-NACL 1000-0.7 MG/100ML-% IV SOLN
INTRAVENOUS | Status: DC | PRN
  Administered 2023-09-04: 1000 mg via INTRAVENOUS

## 2023-09-04 MED ORDER — DROPERIDOL 2.5 MG/ML IJ SOLN
0.6250 mg | Freq: Once | INTRAMUSCULAR | Status: AC | PRN
  Administered 2023-09-04: 0.625 mg via INTRAVENOUS

## 2023-09-04 MED ORDER — DOXYCYCLINE HYCLATE 100 MG PO TABS
200.0000 mg | ORAL_TABLET | Freq: Once | ORAL | Status: AC
  Administered 2023-09-04: 200 mg via ORAL

## 2023-09-04 MED ORDER — SCOPOLAMINE 1 MG/3DAYS TD PT72
1.0000 | MEDICATED_PATCH | Freq: Once | TRANSDERMAL | Status: DC
  Administered 2023-09-04: 1.5 mg via TRANSDERMAL

## 2023-09-04 MED ORDER — ONDANSETRON HCL 4 MG/2ML IJ SOLN
INTRAMUSCULAR | Status: DC | PRN
  Administered 2023-09-04: 4 mg via INTRAVENOUS

## 2023-09-04 MED ORDER — FENTANYL CITRATE (PF) 100 MCG/2ML IJ SOLN
INTRAMUSCULAR | Status: DC | PRN
  Administered 2023-09-04 (×2): 50 ug via INTRAVENOUS

## 2023-09-04 MED ORDER — DEXAMETHASONE SODIUM PHOSPHATE 10 MG/ML IJ SOLN
INTRAMUSCULAR | Status: AC
  Filled 2023-09-04: qty 1

## 2023-09-04 MED ORDER — PROPOFOL 10 MG/ML IV BOLUS
INTRAVENOUS | Status: AC
  Filled 2023-09-04: qty 20

## 2023-09-04 MED ORDER — MIDAZOLAM HCL 5 MG/5ML IJ SOLN
INTRAMUSCULAR | Status: DC | PRN
  Administered 2023-09-04: 2 mg via INTRAVENOUS

## 2023-09-04 MED ORDER — ACETAMINOPHEN 500 MG PO TABS
ORAL_TABLET | ORAL | Status: AC
  Filled 2023-09-04: qty 2

## 2023-09-04 MED ORDER — DROPERIDOL 2.5 MG/ML IJ SOLN
INTRAMUSCULAR | Status: AC
  Filled 2023-09-04: qty 2

## 2023-09-04 MED ORDER — ACETAMINOPHEN 500 MG PO TABS
1000.0000 mg | ORAL_TABLET | Freq: Once | ORAL | Status: AC
  Administered 2023-09-04: 1000 mg via ORAL

## 2023-09-04 MED ORDER — PROPOFOL 10 MG/ML IV BOLUS
INTRAVENOUS | Status: DC | PRN
  Administered 2023-09-04: 80 mg via INTRAVENOUS
  Administered 2023-09-04: 120 mg via INTRAVENOUS

## 2023-09-04 MED ORDER — SCOPOLAMINE 1 MG/3DAYS TD PT72
MEDICATED_PATCH | TRANSDERMAL | Status: AC
  Filled 2023-09-04: qty 1

## 2023-09-04 MED ORDER — FENTANYL CITRATE (PF) 100 MCG/2ML IJ SOLN
25.0000 ug | INTRAMUSCULAR | Status: DC | PRN
  Administered 2023-09-04 (×2): 25 ug via INTRAVENOUS

## 2023-09-04 MED ORDER — LIDOCAINE HCL (CARDIAC) PF 100 MG/5ML IV SOSY
PREFILLED_SYRINGE | INTRAVENOUS | Status: DC | PRN
  Administered 2023-09-04: 60 mg via INTRAVENOUS

## 2023-09-04 MED ORDER — TRANEXAMIC ACID-NACL 1000-0.7 MG/100ML-% IV SOLN
INTRAVENOUS | Status: AC
  Filled 2023-09-04: qty 100

## 2023-09-04 MED ORDER — FENTANYL CITRATE (PF) 100 MCG/2ML IJ SOLN
INTRAMUSCULAR | Status: AC
  Filled 2023-09-04: qty 2

## 2023-09-04 MED ORDER — IBUPROFEN 600 MG PO TABS: 600.0000 mg | ORAL_TABLET | Freq: Four times a day (QID) | ORAL | 0 refills | Status: AC | PRN

## 2023-09-04 MED ORDER — OXYCODONE HCL 5 MG/5ML PO SOLN: 5.0000 mg | Freq: Once | ORAL | Status: AC | PRN

## 2023-09-04 MED ORDER — LIDOCAINE HCL (PF) 2 % IJ SOLN
INTRAMUSCULAR | Status: AC
  Filled 2023-09-04: qty 5

## 2023-09-04 MED ORDER — POVIDONE-IODINE 10 % EX SWAB: 2.0000 | Freq: Once | CUTANEOUS | Status: DC

## 2023-09-04 MED ORDER — KETOROLAC TROMETHAMINE 30 MG/ML IJ SOLN
INTRAMUSCULAR | Status: DC | PRN
  Administered 2023-09-04: 30 mg via INTRAVENOUS

## 2023-09-04 MED ORDER — MIDAZOLAM HCL 2 MG/2ML IJ SOLN
INTRAMUSCULAR | Status: AC
Start: 2023-09-04 — End: ?
  Filled 2023-09-04: qty 2

## 2023-09-04 MED ORDER — DEXAMETHASONE SODIUM PHOSPHATE 10 MG/ML IJ SOLN
INTRAMUSCULAR | Status: DC | PRN
  Administered 2023-09-04: 5 mg via INTRAVENOUS

## 2023-09-04 MED ORDER — DOXYCYCLINE HYCLATE 100 MG PO TABS
ORAL_TABLET | ORAL | Status: AC
  Filled 2023-09-04: qty 2

## 2023-09-04 MED ORDER — LACTATED RINGERS IV SOLN: INTRAVENOUS | Status: DC

## 2023-09-04 MED ORDER — OXYCODONE HCL 5 MG PO TABS
5.0000 mg | ORAL_TABLET | Freq: Once | ORAL | Status: AC | PRN
  Administered 2023-09-04: 5 mg via ORAL

## 2023-09-04 MED ORDER — GLYCOPYRROLATE 0.2 MG/ML IJ SOLN
INTRAMUSCULAR | Status: DC | PRN
  Administered 2023-09-04: .2 mg via INTRAVENOUS

## 2023-09-04 MED ORDER — 0.9 % SODIUM CHLORIDE (POUR BTL) OPTIME
TOPICAL | Status: DC | PRN
  Administered 2023-09-04: 500 mL

## 2023-09-04 MED ORDER — LIDOCAINE 1% INJECTION FOR CIRCUMCISION
INJECTION | INTRAVENOUS | Status: DC | PRN
  Administered 2023-09-04: 10 mL

## 2023-09-04 SURGICAL SUPPLY — 16 items
CATH ROBINSON RED A/P 16FR (CATHETERS) ×1 IMPLANT
FILTER UTR ASPR ASSEMBLY (MISCELLANEOUS) ×1 IMPLANT
GLOVE BIO SURGEON STRL SZ7.5 (GLOVE) IMPLANT
GLOVE BIOGEL PI IND STRL 6 (GLOVE) ×1 IMPLANT
GLOVE SS PI 5.5 STRL (GLOVE) ×1 IMPLANT
GOWN STRL REUS W/TWL LRG LVL3 (GOWN DISPOSABLE) ×1 IMPLANT
HOSE CONNECTING 18IN BERKELEY (TUBING) ×1 IMPLANT
KIT BERKELEY 1ST TRIMESTER 3/8 (MISCELLANEOUS) ×4 IMPLANT
KIT TURNOVER CYSTO (KITS) ×1 IMPLANT
NS IRRIG 500ML POUR BTL (IV SOLUTION) ×1 IMPLANT
PACK VAGINAL MINOR WOMEN LF (CUSTOM PROCEDURE TRAY) ×1 IMPLANT
SLEEVE SCD COMPRESS KNEE MED (STOCKING) ×1 IMPLANT
SOL PREP POV-IOD 4OZ 10% (MISCELLANEOUS) IMPLANT
SPIKE FLUID TRANSFER (MISCELLANEOUS) ×1 IMPLANT
TOWEL OR 17X24 6PK STRL BLUE (TOWEL DISPOSABLE) ×1 IMPLANT
VACURETTE 8 RIGID CVD (CANNULA) ×1 IMPLANT

## 2023-09-04 NOTE — Anesthesia Procedure Notes (Signed)
Procedure Name: LMA Insertion Date/Time: 09/04/2023 2:17 PM  Performed by: Jessica Priest, CRNAPre-anesthesia Checklist: Patient identified, Emergency Drugs available, Suction available, Patient being monitored and Timeout performed Patient Re-evaluated:Patient Re-evaluated prior to induction Oxygen Delivery Method: Circle system utilized Preoxygenation: Pre-oxygenation with 100% oxygen Induction Type: IV induction Ventilation: Mask ventilation without difficulty LMA: LMA inserted LMA Size: 4.0 Number of attempts: 1 Airway Equipment and Method: Bite block Placement Confirmation: positive ETCO2, breath sounds checked- equal and bilateral and CO2 detector Tube secured with: Tape Dental Injury: Teeth and Oropharynx as per pre-operative assessment

## 2023-09-04 NOTE — Interval H&P Note (Signed)
History and Physical Interval Note:  09/04/2023 1:43 PM  Allison Hamilton  has presented today for surgery, with the diagnosis of miscarriage.  The various methods of treatment have been discussed with the patient and family. After consideration of risks, benefits and other options for treatment, the patient has consented to  Procedure(s): DILATATION AND EVACUATION (N/A) as a surgical intervention.  The patient's history has been reviewed, patient examined, no change in status, stable for surgery.  I have reviewed the patient's chart and labs.  Questions were answered to the patient's satisfaction.    Reviewed post-op expectations and time off work. FMLA paperwork done by office today  Pt's mom with her for surgery today  Junius Creamer

## 2023-09-04 NOTE — Discharge Instructions (Addendum)
Call office with any concerns [phone]   No acetaminophen/Tylenol until after 6:22 pm today if needed.  No ibuprofen, Advil, Aleve, Motrin, ketorolac, meloxicam, naproxen, or other NSAIDS until after 8:39 pm today if needed.   Post Anesthesia Home Care Instructions  Activity: Get plenty of rest for the remainder of the day. A responsible individual must stay with you for 24 hours following the procedure.  For the next 24 hours, DO NOT: -Drive a car -Advertising copywriter -Drink alcoholic beverages -Take any medication unless instructed by your physician -Make any legal decisions or sign important papers.  Meals: Start with liquid foods such as gelatin or soup. Progress to regular foods as tolerated. Avoid greasy, spicy, heavy foods. If nausea and/or vomiting occur, drink only clear liquids until the nausea and/or vomiting subsides. Call your physician if vomiting continues.  Special Instructions/Symptoms: Your throat may feel dry or sore from the anesthesia or the breathing tube placed in your throat during surgery. If this causes discomfort, gargle with warm salt water. The discomfort should disappear within 24 hours.  If you had a scopolamine patch placed behind your ear for the management of post- operative nausea and/or vomiting:  1. The medication in the patch is effective for 72 hours, after which it should be removed.  Wrap patch in a tissue and discard in the trash. Wash hands thoroughly with soap and water. 2. You may remove the patch earlier than 72 hours if you experience unpleasant side effects which may include dry mouth, dizziness or visual disturbances. 3. Avoid touching the patch. Wash your hands with soap and water after contact with the patch.

## 2023-09-04 NOTE — Anesthesia Postprocedure Evaluation (Signed)
Anesthesia Post Note  Patient: Estate agent  Procedure(s) Performed: DILATATION AND EVACUATION (Vagina )     Patient location during evaluation: PACU Anesthesia Type: General Level of consciousness: awake and alert Pain management: pain level controlled Vital Signs Assessment: post-procedure vital signs reviewed and stable Respiratory status: spontaneous breathing, nonlabored ventilation and respiratory function stable Cardiovascular status: blood pressure returned to baseline Postop Assessment: no apparent nausea or vomiting Anesthetic complications: no   No notable events documented.  Last Vitals:  Vitals:   09/04/23 1615 09/04/23 1630  BP: 114/76 104/67  Pulse: (!) 56 (!) 57  Resp: 18 20  Temp:  (!) 36.3 C  SpO2: 100% 100%    Last Pain:  Vitals:   09/04/23 1515  TempSrc:   PainSc: 0-No pain                 Shanda Howells

## 2023-09-04 NOTE — Op Note (Signed)
  Operative Note   Pre-Operative Diagnosis: Missed abortion at [redacted] weeks gestation  Postoperative Diagnosis: Missed abortion at [redacted] weeks gestation  Procedure: Suction dilation and curettage   Surgeon: Truett Perna, DO   Urine output: 75 cc   IV fluids: 700 cc   EBL: 300 cc   Operative Findings: Normal appearing external genitalia, multiparous cervix with closed os, enlarged uterus sounding to 8 cm, products of conception within vac currette   Specimen: products of conception     Patient was brought the operating room where she transferred herself to the OR table. SCDs were placed on her calves and turned on. After adequate anesthesia was achieved, the patient placed in the dorsal lithotomy position in [place] stirrups.  She was prepped and draped in the usual sterile fashion. The bladder was drained using a straight catheter. The bivalve speculum was placed in the vagina and the anterior lip of the cervix grasped with a single-tooth tenaculum.  10cc of 1% lidocaine was administered in a paracervical fashion. The cervix was serially dilated with Hank dilators. . An 8 suction curette was advanced to the fundus, the vacuum was engaged, and multiple suction passes were performed until the products of conception were evacuated. Tranexamic acid was administered to decrease bleeding. A Sharp curettage was performed and a gritty texture was noted. A final suction pass was performed with minimal results. This completed the procedure. A bimanual massage was performed and confirmed good uterine tone. Hemostasis was seen. All instruments were removed from the vagina.  Pressure was used to achieve hemostasis at the tenaculum site. The patient tolerated the procedure well was brought to the recovery room in stable condition for the procedure. All sponge and needle counts correct x2.     Truett Perna

## 2023-09-04 NOTE — Transfer of Care (Signed)
Immediate Anesthesia Transfer of Care Note  Patient: Estate agent  Procedure(s) Performed: Procedure(s) (LRB): DILATATION AND EVACUATION (N/A)  Patient Location: PACU  Anesthesia Type: GA  Level of Consciousness: awake, sedated, patient cooperative and responds to stimulation  Airway & Oxygen Therapy: Patient Spontanous Breathing and Patient connected to [place] oxygen  Post-op Assessment: Report given to PACU RN, Post -op Vital signs reviewed and stable and Patient moving all extremities  Post vital signs: Reviewed and stable  Complications: No apparent anesthesia complications

## 2023-09-05 ENCOUNTER — Encounter (HOSPITAL_BASED_OUTPATIENT_CLINIC_OR_DEPARTMENT_OTHER): Payer: Self-pay | Admitting: Student

## 2023-09-08 LAB — SURGICAL PATHOLOGY
